# Patient Record
Sex: Female | Born: 1970 | Race: White | Hispanic: No | Marital: Married | Smoking: Former smoker
Health system: Southern US, Community
[De-identification: ages and names within clinical notes are randomized; demographics above are authoritative.]

## PROBLEM LIST (undated history)

## (undated) DIAGNOSIS — D259 Leiomyoma of uterus, unspecified: Secondary | ICD-10-CM

## (undated) DIAGNOSIS — K5909 Other constipation: Secondary | ICD-10-CM

## (undated) DIAGNOSIS — N3010 Interstitial cystitis (chronic) without hematuria: Secondary | ICD-10-CM

## (undated) DIAGNOSIS — K9041 Non-celiac gluten sensitivity: Secondary | ICD-10-CM

## (undated) DIAGNOSIS — D509 Iron deficiency anemia, unspecified: Secondary | ICD-10-CM

## (undated) DIAGNOSIS — F411 Generalized anxiety disorder: Secondary | ICD-10-CM

## (undated) DIAGNOSIS — N939 Abnormal uterine and vaginal bleeding, unspecified: Secondary | ICD-10-CM

## (undated) DIAGNOSIS — O34219 Maternal care for unspecified type scar from previous cesarean delivery: Secondary | ICD-10-CM

## (undated) DIAGNOSIS — Z973 Presence of spectacles and contact lenses: Secondary | ICD-10-CM

## (undated) DIAGNOSIS — K635 Polyp of colon: Secondary | ICD-10-CM

## (undated) DIAGNOSIS — J302 Other seasonal allergic rhinitis: Secondary | ICD-10-CM

## (undated) DIAGNOSIS — F419 Anxiety disorder, unspecified: Secondary | ICD-10-CM

## (undated) HISTORY — PX: TYMPANOSTOMY TUBE PLACEMENT: SHX32

## (undated) HISTORY — DX: Maternal care for unspecified type scar from previous cesarean delivery: O34.219

## (undated) HISTORY — DX: Polyp of colon: K63.5

## (undated) HISTORY — DX: Anxiety disorder, unspecified: F41.9

## (undated) HISTORY — PX: CHOLECYSTECTOMY: SHX55

---

## 1997-05-25 ENCOUNTER — Other Ambulatory Visit: Admission: RE | Admit: 1997-05-25 | Discharge: 1997-05-25 | Payer: Self-pay | Admitting: Obstetrics and Gynecology

## 1998-07-16 ENCOUNTER — Other Ambulatory Visit: Admission: RE | Admit: 1998-07-16 | Discharge: 1998-07-16 | Payer: Self-pay | Admitting: Gynecology

## 1998-10-17 ENCOUNTER — Encounter: Admission: RE | Admit: 1998-10-17 | Discharge: 1999-01-15 | Payer: Self-pay | Admitting: Gynecology

## 1999-04-08 ENCOUNTER — Inpatient Hospital Stay (HOSPITAL_COMMUNITY): Admission: AD | Admit: 1999-04-08 | Discharge: 1999-04-11 | Payer: Self-pay | Admitting: Gynecology

## 1999-04-08 ENCOUNTER — Encounter (INDEPENDENT_AMBULATORY_CARE_PROVIDER_SITE_OTHER): Payer: Self-pay

## 1999-05-22 ENCOUNTER — Other Ambulatory Visit: Admission: RE | Admit: 1999-05-22 | Discharge: 1999-05-22 | Payer: Self-pay | Admitting: Gynecology

## 2000-05-27 ENCOUNTER — Other Ambulatory Visit: Admission: RE | Admit: 2000-05-27 | Discharge: 2000-05-27 | Payer: Self-pay | Admitting: Gynecology

## 2001-05-27 ENCOUNTER — Other Ambulatory Visit: Admission: RE | Admit: 2001-05-27 | Discharge: 2001-05-27 | Payer: Self-pay | Admitting: Gynecology

## 2002-07-08 ENCOUNTER — Other Ambulatory Visit: Admission: RE | Admit: 2002-07-08 | Discharge: 2002-07-08 | Payer: Self-pay | Admitting: Gynecology

## 2003-07-14 ENCOUNTER — Other Ambulatory Visit: Admission: RE | Admit: 2003-07-14 | Discharge: 2003-07-14 | Payer: Self-pay | Admitting: Gynecology

## 2004-07-16 ENCOUNTER — Other Ambulatory Visit: Admission: RE | Admit: 2004-07-16 | Discharge: 2004-07-16 | Payer: Self-pay | Admitting: Gynecology

## 2005-07-18 ENCOUNTER — Other Ambulatory Visit: Admission: RE | Admit: 2005-07-18 | Discharge: 2005-07-18 | Payer: Self-pay | Admitting: Gynecology

## 2005-08-17 ENCOUNTER — Emergency Department (HOSPITAL_COMMUNITY): Admission: EM | Admit: 2005-08-17 | Discharge: 2005-08-17 | Payer: Self-pay | Admitting: Family Medicine

## 2005-11-04 ENCOUNTER — Encounter: Admission: RE | Admit: 2005-11-04 | Discharge: 2005-11-04 | Payer: Self-pay | Admitting: Gynecology

## 2005-11-12 ENCOUNTER — Encounter: Admission: RE | Admit: 2005-11-12 | Discharge: 2005-11-12 | Payer: Self-pay | Admitting: Gynecology

## 2006-06-10 ENCOUNTER — Emergency Department (HOSPITAL_COMMUNITY): Admission: EM | Admit: 2006-06-10 | Discharge: 2006-06-10 | Payer: Self-pay | Admitting: Emergency Medicine

## 2006-06-11 ENCOUNTER — Ambulatory Visit (HOSPITAL_COMMUNITY): Admission: RE | Admit: 2006-06-11 | Discharge: 2006-06-11 | Payer: Self-pay | Admitting: Emergency Medicine

## 2006-06-13 ENCOUNTER — Emergency Department (HOSPITAL_COMMUNITY): Admission: EM | Admit: 2006-06-13 | Discharge: 2006-06-13 | Payer: Self-pay | Admitting: Emergency Medicine

## 2006-06-16 ENCOUNTER — Encounter: Admission: RE | Admit: 2006-06-16 | Discharge: 2006-06-16 | Payer: Self-pay | Admitting: Gastroenterology

## 2006-06-21 ENCOUNTER — Emergency Department (HOSPITAL_COMMUNITY): Admission: EM | Admit: 2006-06-21 | Discharge: 2006-06-22 | Payer: Self-pay | Admitting: Emergency Medicine

## 2006-06-26 ENCOUNTER — Inpatient Hospital Stay (HOSPITAL_COMMUNITY): Admission: EM | Admit: 2006-06-26 | Discharge: 2006-06-29 | Payer: Self-pay | Admitting: Emergency Medicine

## 2006-06-27 ENCOUNTER — Ambulatory Visit: Payer: Self-pay | Admitting: Gastroenterology

## 2006-06-27 ENCOUNTER — Encounter: Payer: Self-pay | Admitting: Gastroenterology

## 2006-06-27 HISTORY — PX: ESOPHAGOGASTRODUODENOSCOPY (EGD) WITH PROPOFOL: SHX5813

## 2006-06-28 ENCOUNTER — Ambulatory Visit: Payer: Self-pay | Admitting: Gastroenterology

## 2006-06-30 ENCOUNTER — Observation Stay (HOSPITAL_COMMUNITY): Admission: EM | Admit: 2006-06-30 | Discharge: 2006-07-01 | Payer: Self-pay | Admitting: Emergency Medicine

## 2006-06-30 ENCOUNTER — Ambulatory Visit: Payer: Self-pay | Admitting: Internal Medicine

## 2006-07-09 ENCOUNTER — Other Ambulatory Visit: Admission: RE | Admit: 2006-07-09 | Discharge: 2006-07-09 | Payer: Self-pay | Admitting: Gynecology

## 2006-07-22 ENCOUNTER — Ambulatory Visit: Payer: Self-pay | Admitting: Family Medicine

## 2006-07-24 ENCOUNTER — Ambulatory Visit (HOSPITAL_COMMUNITY): Admission: RE | Admit: 2006-07-24 | Discharge: 2006-07-24 | Payer: Self-pay | Admitting: Gastroenterology

## 2006-07-24 ENCOUNTER — Encounter (INDEPENDENT_AMBULATORY_CARE_PROVIDER_SITE_OTHER): Payer: Self-pay | Admitting: Gastroenterology

## 2006-07-24 DIAGNOSIS — K635 Polyp of colon: Secondary | ICD-10-CM

## 2006-07-24 HISTORY — DX: Polyp of colon: K63.5

## 2006-09-04 ENCOUNTER — Encounter: Admission: RE | Admit: 2006-09-04 | Discharge: 2006-09-04 | Payer: Self-pay | Admitting: Gastroenterology

## 2006-09-10 ENCOUNTER — Emergency Department (HOSPITAL_COMMUNITY): Admission: EM | Admit: 2006-09-10 | Discharge: 2006-09-10 | Payer: Self-pay | Admitting: Emergency Medicine

## 2006-10-14 ENCOUNTER — Encounter: Admission: RE | Admit: 2006-10-14 | Discharge: 2006-10-14 | Payer: Self-pay | Admitting: Gynecology

## 2007-01-03 ENCOUNTER — Emergency Department (HOSPITAL_COMMUNITY): Admission: EM | Admit: 2007-01-03 | Discharge: 2007-01-03 | Payer: Self-pay | Admitting: Family Medicine

## 2007-01-27 ENCOUNTER — Inpatient Hospital Stay (HOSPITAL_COMMUNITY): Admission: AD | Admit: 2007-01-27 | Discharge: 2007-01-27 | Payer: Self-pay | Admitting: Obstetrics and Gynecology

## 2007-05-10 ENCOUNTER — Inpatient Hospital Stay (HOSPITAL_COMMUNITY): Admission: AD | Admit: 2007-05-10 | Discharge: 2007-05-10 | Payer: Self-pay | Admitting: Obstetrics and Gynecology

## 2007-05-21 ENCOUNTER — Inpatient Hospital Stay (HOSPITAL_COMMUNITY): Admission: AD | Admit: 2007-05-21 | Discharge: 2007-05-23 | Payer: Self-pay | Admitting: Obstetrics & Gynecology

## 2008-06-06 ENCOUNTER — Encounter: Payer: Self-pay | Admitting: Gynecology

## 2008-06-06 ENCOUNTER — Other Ambulatory Visit: Admission: RE | Admit: 2008-06-06 | Discharge: 2008-06-06 | Payer: Self-pay | Admitting: Gynecology

## 2008-06-06 ENCOUNTER — Ambulatory Visit: Payer: Self-pay | Admitting: Gynecology

## 2008-07-11 ENCOUNTER — Ambulatory Visit: Payer: Self-pay | Admitting: Gynecology

## 2008-08-15 ENCOUNTER — Ambulatory Visit: Payer: Self-pay | Admitting: Gynecology

## 2008-09-14 ENCOUNTER — Ambulatory Visit: Payer: Self-pay | Admitting: Gynecology

## 2008-10-05 ENCOUNTER — Ambulatory Visit: Payer: Self-pay | Admitting: Gynecology

## 2008-11-22 ENCOUNTER — Encounter: Admission: RE | Admit: 2008-11-22 | Discharge: 2008-11-22 | Payer: Self-pay | Admitting: Gynecology

## 2009-02-13 ENCOUNTER — Ambulatory Visit: Payer: Self-pay | Admitting: Gynecology

## 2009-02-21 ENCOUNTER — Ambulatory Visit: Payer: Self-pay | Admitting: Gynecology

## 2009-03-21 ENCOUNTER — Ambulatory Visit: Payer: Self-pay | Admitting: Gynecology

## 2009-06-07 ENCOUNTER — Ambulatory Visit: Payer: Self-pay | Admitting: Gynecology

## 2009-06-07 ENCOUNTER — Other Ambulatory Visit: Admission: RE | Admit: 2009-06-07 | Discharge: 2009-06-07 | Payer: Self-pay | Admitting: Gynecology

## 2009-11-26 ENCOUNTER — Encounter: Admission: RE | Admit: 2009-11-26 | Discharge: 2009-11-26 | Payer: Self-pay | Admitting: Gynecology

## 2010-02-17 ENCOUNTER — Encounter: Payer: Self-pay | Admitting: Gastroenterology

## 2010-02-17 ENCOUNTER — Encounter: Payer: Self-pay | Admitting: Family Medicine

## 2010-06-10 ENCOUNTER — Encounter (INDEPENDENT_AMBULATORY_CARE_PROVIDER_SITE_OTHER): Payer: BC Managed Care – PPO | Admitting: Gynecology

## 2010-06-10 ENCOUNTER — Other Ambulatory Visit (HOSPITAL_COMMUNITY)
Admission: RE | Admit: 2010-06-10 | Discharge: 2010-06-10 | Disposition: A | Discharge status: Home / Self Care | Payer: BC Managed Care – PPO | Source: Ambulatory Visit | Attending: Gynecology | Admitting: Gynecology

## 2010-06-10 ENCOUNTER — Other Ambulatory Visit: Payer: Self-pay | Admitting: Gynecology

## 2010-06-10 DIAGNOSIS — Z01419 Encounter for gynecological examination (general) (routine) without abnormal findings: Secondary | ICD-10-CM

## 2010-06-10 DIAGNOSIS — Z833 Family history of diabetes mellitus: Secondary | ICD-10-CM

## 2010-06-10 DIAGNOSIS — R82998 Other abnormal findings in urine: Secondary | ICD-10-CM

## 2010-06-10 DIAGNOSIS — Z13220 Encounter for screening for lipoid disorders: Secondary | ICD-10-CM

## 2010-06-10 DIAGNOSIS — Z124 Encounter for screening for malignant neoplasm of cervix: Secondary | ICD-10-CM | POA: Insufficient documentation

## 2010-06-10 DIAGNOSIS — R635 Abnormal weight gain: Secondary | ICD-10-CM

## 2010-06-11 NOTE — Op Note (Signed)
Kristy Hancock, [place]                    ACCOUNT NO.:  [id number]   MEDICAL RECORD NO.:  [id number]          PATIENT TYPE:  INP   LOCATION:  A319                          FACILITY:  [facility]   PHYSICIAN:  Kassie Mends, M.D.      DATE OF BIRTH:  [birth year]   DATE OF PROCEDURE:  06/27/2006  DATE OF DISCHARGE:                                PROCEDURE NOTE   PROCEDURE:  Esophagogastroduodenoscopy with cold forceps biopsies of the  esophagus, stomach, and duodenum.   INDICATIONS FOR EXAMINATION:  Mrs. Maclaren is a 40 year old female with  unremitting epigastric and chest pain as well as difficulty swallowing.   FINDINGS:  1. Normal esophagus without evidence of Barrett's, mass, erosions, or      ulcerations.  Biopsied obtained to evaluate for eosinophilic      esophagitis.  Patchy erythema in the body and antrum of the stomach      without evidence of erosions, ulcerations, or mass.  Biopsies      obtained to evaluate for H. pylori gastritis or eosinophilic      gastritis.  2. Normal duodenal bulb and second and third portion of the duodenum.      Biopsies were obtained to evaluate for celiac sprue.   RECOMMENDATIONS:  1. Continue proton pump inhibitor.  The patient should avoid gastric      irritants.  She was given a handout on identifying gastric      irritants.  2. She should continue a bland diet.  3. She should avoid aspirin, NSAIDs, and anticoagulation for 3 days.      Then, she can add Celebrex 100 mg tablets twice daily to help      control her pain.  4. She should continue Elavil 10 mg at night for pain modulation.  She      may use Percocet 5/325 one or 2 every 6 hours as needed for pain.  5. She has a return patient visit to see Dr. Cira Servant in 7 to 10 days.  6. We will call Ms. Hunnicutt with the results of her biopsies.   MEDICATIONS:  1. Demerol 50 mg IV.  2. Versed 4 mg IV.   PROCEDURE TECHNIQUE:  Physical examination was performed and informed  consent was obtained from the patient  after explaining the benefits,  risks, and alternatives to the procedure.  The patient was connected to  the monitor and placed in the left lateral position.  Continuous oxygen  was provided by nasal cannula.  IV medicine was administered through an  indwelling cannula.  After administration of sedation, the patient's  esophagus was intubated  and the scope was advanced under direct visualization to the second  portion of the duodenum.  The scope was subsequently removed slowly  back, carefully examining the color, texture, anatomy of the mucosa on  the way out.  The patient was recovered in Endoscopy and was discharged  to the floor in satisfactory condition.      Kassie Mends, M.D.  Electronically Signed     SM/MEDQ  D:  06/27/2006  T:  06/27/2006  Job:  [id number]   cc:   Emeterio Reeve, MD  Fax: [phone]

## 2010-06-11 NOTE — Discharge Summary (Signed)
NAMEJAILYNNE, Kristy Hancock                    ACCOUNT NO.:  [id number]   MEDICAL RECORD NO.:  [id number]          PATIENT TYPE:  INP   LOCATION:  A319                          FACILITY:  [facility]   PHYSICIAN:  Skeet Latch, DO    DATE OF BIRTH:  [birth year]   DATE OF ADMISSION:  06/26/2006  DATE OF DISCHARGE:  06/02/2008LH                               DISCHARGE SUMMARY   DISCHARGE DIAGNOSIS:  Epigastric pain of unknown etiology.   BRIEF HOSPITAL COURSE:  Ms. Hancock is a 40 year old Caucasian female who  has had about a six week history of abdominal discomfort, mostly in her  epigastric region.  Patient describes it as a burning, pressure-like  sensation that gets worse with eating.  She also states that she has had  early satiety.  The pain would increase with sitting up and sometimes it  gets worse when she lies flat.  The pain seemed to radiate to her back  and chest, especially on the left side.  The patient has seen various  physicians over the past six weeks or so for this abdominal pain.  She  has had multiple CAT scans of the abdomen that showed no acute process.  She also had an ultrasound of the abdomen that did not show any acute  biliary process or gallstones but did note a 9 mm liver hemangioma.  Patient did have a CT of her abdomen on Jun 16, 2006, which was negative  but did show prominent right hilar adenopathy.  Also had an abdominal  CT, which showed no findings.   Patient underwent a HIDA scan on Jun 26, 2006, which was negative.  Patient did undergo an EGD by [facility] two weeks prior,  which was unremarkable, and also saw a rheumatologist, who did not feel  was a rheumatologic issue.   On this admission, as stated above, radiologic scans were done.  The  patient also had a GI consult and had an EGD performed, which is pretty  unremarkable, and now they are awaiting biopsies.  Patient was placed on  IV Protonix, which was increased to b.i.d.  Maalox was added.   It is  felt patient may have esophagitis or esophageal spasm.  The patient  still has some discomfort at present, but it is improved.   PHYSICAL EXAMINATION:  VITALS ON DISCHARGE:  Temperature is 98.4, pulse  78, respirations 28.  Blood pressure is 96/59.  Patient satting at 99%  on room air.  CARDIOVASCULAR:  Regular rate and rhythm with no murmurs, rubs or  gallops noted.  LUNGS:  Clear to auscultation bilaterally.  No rales, rhonchi or  wheezes.  ABDOMEN:  Positive epigastric pain on palpation.  She had some slight  diffuse lower abdominal pain on exam.  EXTREMITIES:  No clubbing, cyanosis, or erythema.   CONDITION ON DISCHARGE:  Stable.   MEDICATIONS ON DISCHARGE:  1. Nexium 40 mg b.i.d.  2. Celebrex 100 mg daily, to be started on July 01, 2006.  3. Elavil 10 mg p.o. nightly.  4. Percocet q.4h. p.r.n. as needed.  I spoke with gastroenterology.  Would hold the hyoscyamine until she is  evaluated at gastroenterology visit.   DIET:  She is to maintain a soft diet.  Avoid any gastric irritants.   ACTIVITY:  No restrictions.   Patient is to follow up with her PCP in the next 1-2 weeks.  She also  has an appointment with Dr. Karilyn Cota in the next few days.   I spoke with patient regarding her pain.  Patient is instructed to  follow up with her primary care physician if symptoms continue prior to  the visit with gastroenterology.  Patient understood these instructions.      Skeet Latch, DO  Electronically Signed     SM/MEDQ  D:  06/29/2006  T:  06/30/2006  Job:  [id number]   cc:   Kassie Mends, M.D.  [address]  [place] , [place] [address]

## 2010-06-11 NOTE — Discharge Summary (Signed)
Kristy Hancock, Kristy Hancock                    ACCOUNT NO.:  [id number]   MEDICAL RECORD NO.:  [id number]          PATIENT TYPE:  INP   LOCATION:  A331                          FACILITY:  [facility]   PHYSICIAN:  R. Roetta Sessions, M.D. DATE OF BIRTH:  [birth year]   DATE OF ADMISSION:  06/30/2006  DATE OF DISCHARGE:  06/04/2008LH                               DISCHARGE SUMMARY   ADMISSION DISCHARGE:  Unrelenting upper abdominal and chest pain not  responding to therapy.   DISCHARGE DIAGNOSIS:  Persistent epigastric/chest pain not responding to  therapy.   PROCEDURES:  She had a CT of abdomen and pelvis with contrast on July 01, 2006.  She was found to have a small cyst associated with the right  ovary, nothing to explain her pain.   HISTORY AND PHYSICAL EXAM:  See yesterday's note.   HOSPITAL COURSE:  [name] is a 40 year old female who presented with  unrelenting upper abdominal pain and chest pain which has not responded  to therapy.  She was initially admitted to the hospital on May 30 after  a previous workup by [facility]. She had a normal EGD. She had a normal CT  of abdomen.  Chest CT angiogram showed a mildly prominent right hilar  adenopathy, possible reactive nodes.  During that hospitalization, she  had a fairly extensive workup, was seen and evaluated by Dr. Cira Servant and  had an EGD on Jun 27, 2006, which showed patchy erythema in the body and  antrum without evidence of erosions, ulcerations or mass. Biopsies were  obtained and eosinophilic esophagitis was ruled out.  Since this  admission, she continues to complain of chest pain and upper abdominal  pain. Once again, she had repeat laboratory studies including CBC, sed  rate, LFTs, and MET-7, amylase and lipase, all of which were normal.  She was shown to have trace microscopic hematuria on a couple of  occasions. Dr. Karilyn Cota ordered a 24-hour urine study, which is still  pending, to look for acute intermittent porphyria.  She is being  transferred [facility] for further  evaluation and possible EUS.  The cause of her upper abdominal pain and  chest pain is undetermined at this time but could be related to  functional abdominal pain, abdominal wall pain, or other etiology.  Discharge condition is good.   DISPOSITION:  She is transferred to [facility] for further management and treatment.     Nicholas Lose, N.P.      Jonathon Bellows, M.D.  Electronically Signed   KC/MEDQ  D:  07/02/2006  T:  07/02/2006  Job:  [id number]

## 2010-06-11 NOTE — H&P (Signed)
Kristy Hancock, Kristy Hancock                    ACCOUNT NO.:  [id number]   MEDICAL RECORD NO.:  [id number]          PATIENT TYPE:  INP   LOCATION:  A319                          FACILITY:  [facility]   PHYSICIAN:  Osvaldo Shipper, MD     DATE OF BIRTH:  [birth year]   DATE OF ADMISSION:  06/26/2006  DATE OF DISCHARGE:  LH                              HISTORY & PHYSICAL   PRIMARY CARE [name]:  The patient sees a physician at [facility], Dr. Laurine Blazer.   GASTROENTEROLOGIST:  She has been seen by [facility] as well in the past.  She is scheduled to see Dr. Karilyn Cota next week.   RHEUMATOLOGIST:  She has also been seen by Dr. Coral Spikes, rheumatologist,  for her problems.   ADMITTING DIAGNOSES:  1. Abdominal pain, unclear etiology.  2. Rule out biliary dyskinesia.   CHIEF COMPLAINT:  Abdominal pain, worse since yesterday.   HISTORY OF PRESENT ILLNESS:  The patient is a 40 year old Caucasian  female who was healthy until about 6 weeks ago, when she started  noticing mild abdominal discomfort mostly in her upper abdomen.  The  pain was described variously as burning sensation/pressure-like  sensation which would get worse with eating.  She would have a bloating  sensation as well.  She also give history of early satiety.  The pain  would increase with food and with sitting up and relieved with lying  down.  The pain would seem to radiate to the back, to the chest and  especially to the left side.  With the pain, she would feel hot and  sweaty, but denies any fever.  No history of nausea or vomiting.  Usually no history of diarrhea.  She has had history of constipation in  the past.  She did have a few episodes of diarrhea yesterday, but none  today.  Sometimes with her bowel movements, she notices mucus.  She  denies any tingling or numbness anywhere.  Pain intensity usually is  10/10; currently, she is experiencing no pain.  She has lost about 2  pounds since all these issues started.  She denies  any problems with  urination.  No vaginal discharge.  Sometimes deep breathing makes the  pain worse as well.   She has been seen extensively by various physicians for this issue.  She  has had 4 ER visits for abdominal pain, counting this one.  She has had  CAT scan of her abdomen with IV contrast only which did not show any  evidence of any acute abdominal process.  She had an ultrasound of the  abdomen which did not show any acute biliary process or gallstones; an  incidental 9-mm liver hemangioma was noted.  She had a CT angiogram of  chest as well which showed mildly prominent hilar adenopathy, but no  other acute process was noted.  She underwent an EGD by [facility] about a week to 2 weeks ago which again was  unremarkable.  She was also referred to a rheumatologist, Dr. Coral Spikes,  who felt that this  was not a rheumatological issue.  She has seen at  least 4-5 physicians in the span of the last 6 weeks.   CURRENT MEDICATIONS:  1. Nexium 40 mg once a day.  2. Percocet as needed.  3. She was started on hyoscyamine sulfate 0.125 mg as needed; she was      started on that yesterday and she took one pill, which seemed to      help.  4. She was also on meloxicam for a brief period, which was also      discontinued yesterday.   ALLERGIES:  No known drug allergies.   PAST MEDICAL HISTORY:  Really no medical problems before all this  started.  She has had some ovarian cysts for which she is following with  her gynecologist.  C-section in 2001, no other surgery.  She still has  her gallbladder and appendix.  Last menstrual period was May 5, no  vaginal discharge.   SOCIAL HISTORY:  Lives at [facility] with her husband.  No smoking  use.  No alcohol use or illicit drug use.  Independent with her daily  activities.   FAMILY HISTORY:  Positive for some thyroid issues, polyps, colon cancer,  nothing else elicited.   REVIEW OF SYSTEMS:  Positive for weakness, malaise.   HEENT:  Actually  unremarkable.  GI:  As in HPI.  GU:  Unremarkable.  CARDIOVASCULAR:  Unremarkable.  RESPIRATORY:  Unremarkable.  MUSCULOSKELETAL:  Unremarkable.  NEUROLOGICAL:  Unremarkable.  ENDOCRINE:  Unremarkable.  DERMATOLOGIC:  Unremarkable.  OTHER SYSTEMS:  Unremarkable.   PHYSICAL EXAMINATION:  VITAL SIGNS:  Temperature 97.8, blood pressure  120/83, heart rate 100, respiratory rate 18, saturation 100% on room  air.  GENERAL:  This is a thin white female in no acute distress, appears very  anxious.  HEENT:  There is no pallor, no icterus.  Oral mucous membranes are  moist.  No oral lesions are noted.  LUNGS:  Clear to auscultation bilaterally.  CARDIOVASCULAR:  S1 and S2 are normal, regular.  No murmur is  appreciated.  No S3 or S4.  No carotid bruits.  ABDOMEN:  Soft, scaphoid.  Bowel sounds are present.  Mild guarding is  present, otherwise soft; actually, I could not elicit any tenderness  except for mild tenderness in the epigastric area.  No organomegaly was  appreciated.  No masses were noted.  RECTAL:  Exam is deferred at this time.  EXTREMITIES:  Without edema.  Peripheral pulses are palpable.  NEUROLOGICAL:  The patient is alert, oriented x3 and anxious.  No focal  neurologic deficits are present.   LABORATORY DATA:  Her labs from today are actually completely  unremarkable; this includes a CBC, a CMET and lipase.  She did have a  glucose of 107; that was on the labs from May 26, but also completely  unremarkable.  Back in 2001, she was anemic for a brief period, but her  hemoglobin now is normal.   IMAGING STUDIES:  Nothing done today, but as discussed earlier, she has  had CAT scans and ultrasounds of her abdomen.  The patient tells me that  there was a plan for her to undergo an MRI as well.   ASSESSMENT AND PLAN:  This is a 40 year old Caucasian female with no real medical problems, who presents with a 6-week history of abdominal  pain which got worse over  the past 2 days, especially overnight,  requiring an emergency room visit.  She has been to  the emergency room  at least 4 times in the past 1 month.  Differential diagnoses,  considering her history, include biliary dyskinesia.  Irritable bowel  syndrome is also a possibility.  History was also suggestive of  gastritis, but with the negative esophagogastroduodenoscopy, that is now  unlikely.  She had a CAT scan of her abdomen, though only with  intravenous contrast, which has failed to reveal any pathology in any of  the other organs.  Pancreatitis is unlikely with a normal lipase.   PLAN:  Abdominal pain:  I think this patient needs to be brought in to  undergo HIDA scan and to be evaluated by GI to see if she has any  biliary dyskinesis which could be causing these symptoms.  GI may also  recommend that the patient undergo a repeat CAT scan of her abdomen and  pelvis.  For now we will withhold narcotic agents so that we can  possibly do this HIDA scan sometime today.  A urine pregnancy test would  also be ordered, since it has not been done today.  Proton pump  inhibitor will be given.  We will put her on DVT and GI prophylaxis.  She is a full code.  TSH will be ordered and then we will again defer to  GI if they want to order any further testing at this point.  I have told  the patient if the HIDA scan is negative and if GI does not want to do  any more testing, she can probably go home on Saturday and I have told  her that she may need to be referred to an academic center for further  evaluation.  In any case, the patient will be admitted to undergo HIDA  scan and to be seen by GI.      Osvaldo Shipper, MD  Electronically Signed     GK/MEDQ  D:  06/26/2006  T:  06/26/2006  Job:  [id number]   cc:   Lionel December, M.D.  [address]  [place]  [place] [address]

## 2010-06-11 NOTE — Consult Note (Signed)
Kristy Hancock, Kristy Hancock                    ACCOUNT NO.:  [id number]   MEDICAL RECORD NO.:  [id number]          PATIENT TYPE:  INP   LOCATION:  A319                          FACILITY:  [facility]   PHYSICIAN:  Kassie Mends, M.D.      DATE OF BIRTH:  [birth year]   DATE OF CONSULTATION:  06/26/2006  DATE OF DISCHARGE:                                 CONSULTATION   REQUESTING PHYSICIAN:  [facility].   PRIMARY CARE PHYSICIAN:  Dr. Johnn Hancock, Kristy Hancock, [place].   REASON FOR CONSULTATION:  Abdominal pain.   HISTORY OF PRESENT ILLNESS:  Kristy Hancock is a 40 year old, Caucasian female  with an 8-week history of epigastric soreness that is quite severe at  times.  She rates the pain 10/10 at its worst.  She tells me that  sitting makes it worse as does movement.  She feels better when she is  lying supine.  She also has a chest pressure and pain is worse after  eating.  The pain waxes and wanes.  She had an EGD by Dr. Laural Benes on Jun 18, 2006, which was normal.  She was scheduled to have an MRI of her  chest and abdomen tomorrow on an outpatient basis ordered by Dr.  Laurine Blazer.  She had abdominal ultrasound on May 15, which showed a 9-mm,  hepatic hemangioma.  She had a CT of the abdomen with contrast which was  negative on May 20.  She had a chest CT angiogram which showed a mildly  prominent right hilar adenopathy question reactive nodes.  She had a  negative HIDA scan today.  She had a normal CBC, LFTs, MET 7, amylase  and lipase.  Urine HCG was negative.  She had a urinalysis which showed  trace blood and a few squamous epithelial cells.  She has tried Nexium  40 mg daily and took it for about a week and did not notice any  difference.  She has resumed this a couple of days prior to admission.  She denies any nausea or vomiting.  She has tried Mylanta which does  seem to help.  She has tried Vicodin, oxycodone and meloxicam at home  none of which seem to completely resolve her pain.  She  denies any  typical heartburn and indigestion symptoms.  Denies any regurgitation,  anorexia, satiety, dysphagia or odynophagia.  She tells me that the pain  worsened after she raked on Mother's Day.   PAST MEDICAL HISTORY:  1. Ovarian cyst with C-section.  2. She was evaluated Dr. Coral Spikes in [place] to rule out      rheumatology problems/fibromyalgia.   MEDICATIONS PRIOR TO ADMISSION:  1. Nexium 40 mg daily.  2. Hyoscyamine 0.125 mg q.8 h. p.r.n.  3. Oxycodone p.r.n.   ALLERGIES:  No known drug allergies.   FAMILY HISTORY:  Positive for father with adenomatous polyps and  maternal grandmother with adenomatous polyps.   SOCIAL HISTORY:  She is married.  She has one healthy son.  She has a  remote history of tobacco use.  She denies  any alcohol or drug use.   REVIEW OF SYSTEMS:  CONSTITUTIONAL:  Weight stable.  Denies any fever or  chills.  CARDIOVASCULAR:  She does complain of palpitations.  PULMONARY:  She has noticed some shortness of breath on exertion.  Denies any cough  or hemoptysis.  GI:  See HPI.  Denies any rectal bleeding or melena.  She tells me she alters with constipation occasionally diarrhea, but  this has been longstanding for her.  GYN:  Last menstrual period was Jun 01, 2006.  Her cycles have been somewhat irregular.   PHYSICAL EXAMINATION:  VITAL SIGNS:  Weight 50.9 kg, height 62 inches,  temperature 91, blood pressure 95/49, pulse 69, respirations 20.  GENERAL:  Kristy Hancock is an alert, oriented, pleasant, cooperative,  Caucasian female in no acute distress.  She does appear quite anxious.  She is accompanied by her husband.  HEENT.  Pupils are clear and nonicteric, dry.  Oropharynx pink, moist  without lesions.  NECK:  Supple without thyromegaly.  CHEST:  Regular rhythm.  Normal S1, S2 without murmurs, rubs or gallops.  LUNGS:  Clear to auscultation bilaterally.  She does have mild  tenderness to her sternal borders bilaterally.  ABDOMEN:  Positive bowel  sounds x4.  No bruits auscultated.  Soft,  nondistended.  She does have tenderness to her epigastrium and upper  abdomen with straight leg elevation and chin tuck.  She denies any  rebound tenderness or guarding.  There is no CVAT.  No negative Murphy  sign.  EXTREMITIES:  Without clubbing or edema bilaterally.  SKIN:  Pink warm and dry without any rash or jaundice.   IMPRESSION:  Kristy Hancock is 40 year old female with an 8-week history of  epigastric/chest pressure and pain.  She has had an extensive workup as  described above which includes a negative esophagogastroduodenoscopy,  HIDA scan, ultrasound, abdominal CT with contrast except for right hilar  adenopathy.  She does have significant abdominal wall tenderness  suspicious for abdominal wall pain.  She may have an element of the  nonerosive reflux disease and less likely would be referred back  pain/disc disease.  She is going to need to follow up with her primary  care physician regarding her hilar adenopathy.   RECOMMENDATIONS:  1. Dr. Cira Servant to re-examine and consider abdominal wall pain therapy.  2. I would suggest that she continue PPI.   We would like to thank the [facility] team for allowing [place] to  participate in the care of Kristy Hancock.      Lorenza Burton, N.P.      Kassie Mends, M.D.  Electronically Signed    KJ/MEDQ  D:  06/26/2006  T:  06/26/2006  Job:  [id number]   cc:   Kristy Hancock, M.D.

## 2010-06-11 NOTE — H&P (Signed)
Kristy Hancock                    ACCOUNT NO.:  [id number]   MEDICAL RECORD NO.:  [id number]          PATIENT TYPE:  INP   LOCATION:  A331                          FACILITY:  [facility]   PHYSICIAN:  Lionel December, M.D.    DATE OF BIRTH:  1970-12-01   DATE OF ADMISSION:  06/30/2006  DATE OF DISCHARGE:  LH                              HISTORY & PHYSICAL   REASON FOR ADMISSION:  Unrelenting upper abdominal and chest pain not  responding to therapy.   HISTORY OF PRESENT ILLNESS:  Kristy Hancock is a 40 year old Caucasian female who  was in the usual state of health until Jun 08, 2006, when she noted  midepigastric pain.  It was mild to moderate.  Over the next 2 days pain  involved her upper abdomen and became very intense.  She also  experienced tightness in her chest.  She did not have fever, chills,  dyspnea, nausea or vomiting.  She was begun on a PPI.  She has seen Dr.  Johnn Hai as her primary care physician recently of with onset of  this pain.  Three days after its onset she was seen in emergency room at  [facility].  She had routine blood work, which was within normal limits.  She  had an upper abdominal ultrasound which was negative for cholelithiasis  or dilated bile ducts.  She had a 9 mm echogenic focus in her liver  consistent with a small hemangioma.  The patient was advised to follow  with her primary care physician.  Until this point she did not have one.  She saw Dr. Johnn Hai.  She was begun on a PPI.  She underwent  esophagogastroduodenoscopy by Dr. Olivia Canter.  This exam was normal.  She was given some pain medicine.  She was reevaluated in [facility] emergency  room on May 17 and then again on May 26.  Apparently she was also seen  in emergency room at [facility] and on her second visit she was  admitted, which was on Jun 26, 2006 and again on May 26.  Apparently she  also came to ER at [facility].  Since she was not responding to  therapy, it was felt that she may be  having musculoskeletal arthritic  pain.  She was seen by Dr. Lennox Pippins.  He did not find any  abnormality to account for her pain.   She had abdominopelvic CT on Jun 16, 2006, along with a chest CT.  She  had no evidence of a liver lesion or dilated biliary system.  Pancreas  was also normal.  Chest CT was negative for PE or aneurysm; however, she  was noted to have mildly prominent right hilar lymph nodes.   The patient was finally admitted to [facility] on Jun 26, 2006,  to the hospitalist service.  She was seen in consultation by Dr. Anselmo Rod of our group.  She had a hepatobiliary scan with EF of 58%, which  was normal.  Once again her CBC, LFTs, calcium, electrolytes, amylase  and lipase  were all within normal limits.  She had a urine HCG, which  was negative, as she had had one at [facility] 2 weeks earlier.  The  patient had repeat EGD on Jun 27, 2006.  This, not surprisingly, was  within normal limits.  Biopsy was taken from esophagus, stomach, as well  as from the third part of the duodenum.  Biopsy results were available  earlier today and within normal limits.   She also had H. pylori serology on Jun 22, 2006, which was normal   She had C-reactive protein of 0.0 on Jun 26, 2006.   The patient was begun on low-dose amitriptyline.  She was feeling better  yesterday and was discharged by Dr. Edsel Petrin.  She was advised not  to take meloxicam, which she had taken a few times.  She was advised to  keep a symptom diary.   The patient returned today with intractable pain across her upper  abdomen and some in her chest.  Once again she did not experience fever,  chills, nausea, vomiting or dyspnea.  She was seen by Dr. Margretta Ditty.  Her  lab studies were repeated and was unremarkable.  She was given Dilaudid  IV for pain control.   Kristy Hancock states she has had a feeling of cold, or at times she feels warm  when she has these episodes.  She states since its onset,  she has never  been-pain free although at times it is a mild pain or soreness.  Other  times it is his stabbing pain across her upper abdomen.  Pain is made  worse when she stoops, bends forward.  She also feels bloated.  She has  had irregular bowel movements, which she feels is due to narcotic  therapy and/or antacids.  However, she has not had any melena or rectal  bleeding.  She has been afraid to eat.  She has not experienced any  heartburn, although at times she has felt that food is not digesting.  This morning she had normal stool.  She denies urinary or vaginal  symptoms.  She was on birth control pills until 2 months ago, which she  stopped on her own.  She states she has done this in the past and has  not had any side effects when she has stopped this medicine.  She does  not feel stress.  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  There is no history  of headache, skin rash, or recent insect bites.   She is on:  1. Amitriptyline 10 mg daily.  2. Nexium 40 mg b.i.d.  3. Percocet 5/325 mg q.4h. p.r.n.  She took 5 pills between 11 p.m.      last night through 11 a.m. this morning.  4. Levsin SL t.i.d. p.r.n.  5. Elavil 10 mg p.o. q.h.s.  6. Celebrex 100 mg daily, but she did not take it today or yesterday.   PAST MEDICAL HISTORY:  She had C-section in 2000.  She used to  experience migraines but has not had an episode lately.  History of  pelvic pain or menstrual cramps, easily controlled with birth control  pills but she stopped that 2 months ago.   ALLERGIES:  None known.   FAMILY HISTORY:  Noncontributory.  She had a brother who died of an  accident at age 11.  Her father is in good health.  Her mother has had a  problem with ethanol abuse.   SOCIAL HISTORY:  She is married.  She has one healthy 58-year-old son.  She smoked altogether for 6-8 years but has not for the last 8 years. She drinks alcohol  occasionally, maybe a drink or two to every 3 months.  She has worked with [employer] in the past but for the last year she is working  with [employer].  Her husband is in good health.   PHYSICAL EXAMINATION:  GENERAL:  A pleasant, well-developed, thin  Caucasian female who appears to be comfortable (she was seen after she  had been given Dilaudid IV).  She weighs 49.8 kg.  She is 62 inches  tall.  VITAL SIGNS:  Pulse 94 per minute, blood pressure 112/68, respirations  18, and temperature is 98.9.  HEENT:  Conjunctivae are pink.  Sclerae are nonicteric.  Oropharyngeal  mucosa is normal.  NECK:  No neck masses are noted.  CHEST:  She does not have tenderness over her sternum or costal  cartilages.  CARDIAC:  With regular rhythm.  Normal S1, S3.  No murmur or gallop  noted.  LUNGS:  Clear to auscultation.  ABDOMEN:  Flat.  Bowel sounds are hyperactive.  On palpation is soft  with mild to moderate mid epigastric tenderness.  No hepatomegaly or  splenomegaly.  RECTAL:  Examination deferred.  EXTREMITIES:  No clubbing or edema noted.   Labs from this afternoon:  WBC 8.1, H&H is 14.8 and 42.2, platelet count  is 350,000.  Sodium 137, potassium 3.8, chloride 101, CO2 is 26, glucose  97, BUN 10, creatinine 1.0, calcium is 10.2.  Her amylase is 28, lipase  22.  AST is 29, ALT 35, bilirubin is 0.4, total protein 8.2 and albumin  is 4.7, bilirubin is 0.4, alkaline phosphatase is 66, AST 29, ALT 35,  total protein 8.2 with albumin of 4.7.   ASSESSMENT:  Kristy Hancock is a 40 year old Caucasian female with about a 3 to 3-  1/2-week history of upper abdominal and chest pain, who has had  extensive workup.  She had ultrasound, which was negative for  cholelithiasis or dilated biliary system, with over a 3-week history of  upper abdominal pain as well as chest pain.  She has undergone multiple  studies.  Her CBC, LFTs, serum calcium amylase and lipase have been  normal repeatedly.  She had two  EGDs.  The second EGD was done here over  the weekend, which was normal.  She had normal biopsy from the  esophagus, stomach and duodenum.  She also has undergone abdominopelvic  CT, which was unremarkable, and a chest CT with prominent right hilar  lymph nodes.  This is suspected to be reactive and I feel has nothing to  do with her  symptomatology but nevertheless needs to be further  evaluated.   Given her presentation, one has to wonder about a sphincter of Oddi  dysfunction or small stones in her bile duct which could not be picked  up on imaging studies, but I would expect a bump in her transaminases  given the severity and frequency of pain that she has gone through.  She  could also have a pancreas divisum, but once again her symptoms are  atypical.  She has not experienced any vomiting or bump in her amylase  and lipase but, normal enzymes would not rule out this diagnosis.  In  the end, it may turn out to be chronic dyspepsia, but we need to rule  out a number of conditions including acute intermittent porphyria.  One  also has to wonder if she has a migraine equivalent.  I feel that her  abdominal and chest pain are all tied together.  I doubt that she has  mitral valve prolapse, and she does not have any risk factors for  coronary artery disease other than a prior history of smoking, and she  has never been a heavy smoker.   PLAN:  She will be admitted for pain control.  We will use Dilaudid IV.  Will start on dicyclomine 10 mg t.i.d. and hold off Elavil for now.  Will also check a sedimentation rate and repeat her LFTs in the a.m.   We will make plans for her to be transferred [facility] for EUS to be followed  by ERCP if EUS is negative and if the GI physicians at [facility] agree with  this algorithm.   While she is here, will proceed with MR of chest to further evaluate  right hilar adenopathy.      Lionel December, M.D.  Electronically Signed     NR/MEDQ  D:  06/30/2006   T:  07/01/2006  Job:  [id number]   cc:   Johnn Hai, M.D.  [facility]  [place], [place]

## 2010-06-11 NOTE — Op Note (Signed)
NAMEJEARLINE, Kristy Hancock                    ACCOUNT NO.:  [id number]   MEDICAL RECORD NO.:  [id number]          PATIENT TYPE:  AMB   LOCATION:  ENDO                         FACILITY:  [facility]   PHYSICIAN:  Anselmo Rod, M.D.  DATE OF BIRTH:  08/22/1970   DATE OF PROCEDURE:  DATE OF DISCHARGE:                               OPERATIVE REPORT   PROCEDURE PERFORMED:  Colonoscopy at cold biopsies x6.   ENDOSCOPIST:  Anselmo Rod, M.D.   INSTRUMENT USED:  Pentax video colonoscope.   INDICATIONS FOR PROCEDURE:  A 40 year old white female with a history of  sudden change in bowel habits over the last couple of months with severe  constipation and abdominal pain, undergoing colonoscopy to rule out  colonic polyps, masses, etc.   PREPROCEDURE PREPARATION:  Informed consent was procured from the  patient.  The patient had fasted for 8 hours prior to the procedure and  prepped with a bottle of magnesium citrate and a gallon of NuLYTELY the  night prior to the procedure.  The risks and benefits of the procedure  including a 10% miss rate of cancer and polyp were discussed with the  patient as well.   PREPROCEDURE PHYSICAL:  VITAL SIGNS:  The patient had stable vital  signs.  NECK:  Supple.  CHEST:  Clear to auscultation.  S1, S2 regular.  ABDOMEN:  Soft with normal bowel sounds.   DESCRIPTION OF PROCEDURE:  The patient was placed in the left lateral  decubitus position and sedated with 85 mcg of Fentanyl and 8.5 mg of  Versed given intravenously in slow incremental doses.  Once the patient  was adequately sedate and maintained on low-flow oxygen and continuous  cardiac monitoring, the Pentax video colonoscope was advanced from the  rectum to the cecum.  The appendiceal orifice and ileocecal valve were  visualized and photographed.  The patient had a fairly good prep.  The  terminal ileum appeared healthy and without lesions.  No masses, polyps,  erosions, ulcerations or diverticula were seen  throughout the colon  except for a few small sessile polys in the rectum.  Retroflexion in the  rectum revealed no abnormalities.  Six small sessile polyps were  biopsied from the rectum (cold biopsies x6).  The rest of the exam was  unremarkable.  The patient tolerated the procedure well without  immediate complications.  The patient had some abdominal discomfort with  insufflation of air into the colon, indicating a component of visceral  hypersensitivity consistent with IBS.   IMPRESSION:  1. Six small sessile polyps biopsied from the rectum, otherwise normal      colon up to the terminal ileum.  No evidence of diverticulosis.  2. Abdominal discomfort with insufflation of air into the colon,      indicating a component of visceral hypersensitivity consistent with      irritable bowel syndrome.   RECOMMENDATIONS:  1. Await pathology results.  2. Repeat colonoscopy depending on pathology results.  3. Avoid all nonsteroidals for the next 2 weeks.  4. Continue MiraLax and Colace as needed  for the constipation.  5. Continue a high-fiber diet.  6. Outpatient follow-up as the need arises in the future.      Anselmo Rod, M.D.  Electronically Signed     JNM/MEDQ  D:  07/24/2006  T:  07/24/2006  Job:  [id number]   cc:   Gaetano Hawthorne. Lily Peer, M.D.  Fax: [phone]   Sharlot Gowda, M.D.  Fax: [phone]

## 2010-06-14 NOTE — Discharge Summary (Signed)
[facility]  Patient:    Kristy Hancock, Kristy Hancock                           MRN: [id number] Adm. Date:  [id number] Disc. Date: [id number] Attending:  Tonye Royalty Dictator:   Antony Contras, RNC, [facility]                           Discharge Summary  DISCHARGE DIAGNOSES:          1. Intrauterine pregnancy at term.                               2. Poor biophysical profile with nonreassuring fetal                                  heart rate tracing.  PROCEDURE:                    Primary low transverse cesarean section.  HISTORY OF PRESENT ILLNESS:   The patient is a 40 year old, gravida 1, para 0, ith LMP of July 08, 1998, [facility] of April 13, 1999.  Prenatal course was uncomplicated  until 39-3/7 weeks when the patient presented to the office complaining of decreased fetal movement.  Fetal heart tones were appreciated at that time, but she did have a nonreactive nonstress test and a biophysical profile of 4 to 8 which was actually 4 to 10 when taken into consideration a nonstress test.  Amniotic fluid was 14.5 in the 60th percentile.  Because of the low biophysical profile, the patient was admitted for induction of labor at this time.  PRENATAL LABORATORY DATA:     Blood type is O positive, rubella positive, HBSAG, HIV, RPR all negative.  MSAFP normal.  GBS was negative.  HOSPITAL COURSE:              The patient was admitted for induction of labor. She was brought in initially for cervical ripening after placement of Cervidil.  30 to 40 minutes later she began having some late decellerations.  This resolved and he continued to have spontaneous contractions and had episodes of uterine contractions that lasted four to five minutes and at times would have repeat decellerations.  The decision was made after counseling both the patient and husband to proceed ith cesarean section.  She was taken to the operating room where a primary low transverse  cesarean section was performed by Gaetano Hawthorne. Lily Peer, M.D. under epidural anesthesia.  The patient was delivered of an Apgars 8 and 9, female infant. Weight was 6 pounds 13 ounces.  Cord pH was 7.27, estimated blood loss was 800 cc. Postoperative course was uncomplicated.  The patient remained afebrile and was ble to be discharged on her third postoperative day.  Postoperative CBC was as follows; hematocrit 27.3, hemoglobin 9.9, White blood cells 11.9, platelets 158.  The patient was discharged with infant in satisfactory condition on third postoperative day.  DISPOSITION:                  She was to be followed up in the office in four to six weeks.  She was discharged with prenatal vitamins, iron, Tylox one to two p.o. p.r.n. pain. DD:  04/26/99 TD:  04/26/99 Job: [id number]  [id number]

## 2010-06-14 NOTE — Op Note (Signed)
[facility]  Patient:    Kristy Hancock, Kristy Hancock                           MRN: [id number] Proc. Date: 04/07/99 Adm. Date:  [id number] Attending:  Tonye Royalty                           Operative Report  PREOPERATIVE DIAGNOSES:       1. Term intrauterine pregnancy.                               2. Poor biophysical profile.                               3. Nonreassuring fetal heart rate tracing.  POSTOPERATIVE DIAGNOSES:      1. Term intrauterine pregnancy.                               2. Poor biophysical profile.                               3. Nonreassuring fetal heart rate tracing.  OPERATION:                    Primary lower uterine segment transverse cesarean                               section. SURGEON:                      Juan H. Lily Peer, M.D.  ASSISTANT:                    Alvino Chapel, M.D.  ANESTHESIA:                   Epidural  ESTIMATED BLOOD LOSS:  INDICATIONS:                  A 40 year old, Gravida 1, Para 0, who was seen in the office earlier today complaining of decreased fetal movement and nonreactive NST resulted in a follow up physical profile with total score of 4/10.  The patient was brought in for cervical ripening and induction.  After placement of the Cervidil 30 to 40 minutes later, the patient began having late decelerations.  This resolved and she continued to have spontaneous contractions and had episodes of uterine tetanic contractions that lasted four to five minutes and at times would have repeat late decelerations.  FINDINGS:                     Viable female infant, Apgars of 8 and 9 with a weight of 6 pounds 13 ounces.  Arterial cord pH of 7.27.  Clear amniotic fluid was present.  Normal maternal uterus, tubes and ovaries.  DESCRIPTION OF PROCEDURE:     After the patient was adequately counselled, she as taken to the operating room where she was placed in the supine position.   A Foley catheter had previously been inserted in labor and delivery and the epidural was on board.  The abdomen was  prepped and draped in the usual sterile fashion.  A Pfannenstiel skin incision was made 2 cm above the symphysis pubis.  The incision was carried down from the skin and subcutaneous tissue down to the rectus fascia where by a midline nick was made.  The fascia was incised in a transverse fashion. The midline raphe was entered.  The peritoneal cavity was entered cautiously. he bladder flap was developed and the lower uterine segment was incised in a transverse fashion.  Clear amniotic fluid was present.  A newborn was delivered. There was no evidence of any nuchal cord.  Clear amniotic fluid was present. The cord was doubly clamped and excised and was passed off to the pediatricians who  gave the above mentioned parameters.  After cord blood was obtained, the uterus was exteriorized.  The placenta was delivered from the intrauterine cavity. Remaining products of conception were swept clear with lap sponges.  The uterine incision was closed with a running stitch of 0 Vicryl suture in a locking stitch manner. The uterus was then placed back into the abdominal pelvic cavity.  The pelvic cavity was copiously irrigated with normal saline solution.  Sponge count and needle count were correct.  The visceral peritoneum was not reapproximated.  The rectus fascia was closed with a running stitch of 0 Vicryl suture.  The subcutaneous bleeders  were Bovie cauterized and the skin was reapproximated with skin clips followed y placement of Xeroform gauze and 4 x 8 dressing.  The patient was transferred to the recovery room with stable vital signs.  Blood loss was 800 cc.  IV fluids was 1000 cc of lactated Ringers.  Urine output was 200 cc and clear.  The patient received a gram of Cefotan IV and Pitocin 10 units per liter was added as a uterotonic agent. DD:  04/08/99 TD:   04/09/99 Job: [id number]

## 2010-07-15 ENCOUNTER — Ambulatory Visit (INDEPENDENT_AMBULATORY_CARE_PROVIDER_SITE_OTHER): Payer: BC Managed Care – PPO | Admitting: Gynecology

## 2010-07-15 DIAGNOSIS — R82998 Other abnormal findings in urine: Secondary | ICD-10-CM

## 2010-07-15 DIAGNOSIS — R350 Frequency of micturition: Secondary | ICD-10-CM

## 2010-07-15 DIAGNOSIS — N926 Irregular menstruation, unspecified: Secondary | ICD-10-CM

## 2010-07-15 DIAGNOSIS — N390 Urinary tract infection, site not specified: Secondary | ICD-10-CM

## 2010-08-01 ENCOUNTER — Encounter: Payer: Self-pay | Admitting: Gynecology

## 2010-08-01 ENCOUNTER — Ambulatory Visit (INDEPENDENT_AMBULATORY_CARE_PROVIDER_SITE_OTHER): Payer: BC Managed Care – PPO | Admitting: Gynecology

## 2010-08-01 DIAGNOSIS — N949 Unspecified condition associated with female genital organs and menstrual cycle: Secondary | ICD-10-CM

## 2010-08-01 DIAGNOSIS — Z3041 Encounter for surveillance of contraceptive pills: Secondary | ICD-10-CM

## 2010-08-01 DIAGNOSIS — Z30431 Encounter for routine checking of intrauterine contraceptive device: Secondary | ICD-10-CM

## 2010-08-01 DIAGNOSIS — R350 Frequency of micturition: Secondary | ICD-10-CM

## 2010-08-01 DIAGNOSIS — R82998 Other abnormal findings in urine: Secondary | ICD-10-CM

## 2010-08-20 ENCOUNTER — Telehealth: Payer: Self-pay | Admitting: *Deleted

## 2010-08-20 NOTE — Telephone Encounter (Signed)
Patient can try Junel 1/20 or Cryselle. Star with new cysle. #30 with 11 refills or until her nexr annual

## 2010-08-20 NOTE — Telephone Encounter (Signed)
PT CALLED STATING THAT SHE LIKES BEING ON ALESSE BCP BUT HER INSURANCE DOES NOT COVER HER PILLS. PT WANTS TO KNOW IF SHE CAN POSSIBLY SWITCH TO LESS EXPENSIVE PILL? SHE WANTS SOMETHING SIMILAR TO ALESSE IF POSSIBLE. PT STATES IF NONETHING CHEAPER THEN SHE WILL STICK WITH ALESSE. PLEASE ADVISE. THANKS

## 2010-08-21 NOTE — Telephone Encounter (Signed)
PT CALLED OFFICE TODAY C/O LOWER ABDOMEN PAIN SEE CHART NOTE (08/06/10) PER JF NOTE HE WANTS PT TO DR.TANNENBAUM AT [facility]. PT INFORMED TO SEE THEM REGARDING ISSUES. PER JF NOTE DUE TO RECURRENT OF PAIN. PT STATES PAIN IS TOLERABLE. PT ALSO STATES THAT SHE WILL CONTINUE TO TAKE HER ALESSE BCP.(08/20/10 NOTE BELOW)

## 2010-08-22 ENCOUNTER — Other Ambulatory Visit: Payer: Self-pay | Admitting: Obstetrics

## 2010-08-22 DIAGNOSIS — R102 Pelvic and perineal pain: Secondary | ICD-10-CM

## 2010-08-22 DIAGNOSIS — D219 Benign neoplasm of connective and other soft tissue, unspecified: Secondary | ICD-10-CM

## 2010-08-23 ENCOUNTER — Ambulatory Visit (HOSPITAL_COMMUNITY): Payer: BC Managed Care – PPO

## 2010-08-23 ENCOUNTER — Ambulatory Visit (HOSPITAL_COMMUNITY)
Admission: RE | Admit: 2010-08-23 | Discharge: 2010-08-23 | Disposition: A | Discharge status: Home / Self Care | Payer: BC Managed Care – PPO | Source: Ambulatory Visit | Attending: Obstetrics | Admitting: Obstetrics

## 2010-08-23 ENCOUNTER — Other Ambulatory Visit (HOSPITAL_COMMUNITY): Payer: BC Managed Care – PPO

## 2010-08-23 DIAGNOSIS — D219 Benign neoplasm of connective and other soft tissue, unspecified: Secondary | ICD-10-CM

## 2010-08-23 DIAGNOSIS — N949 Unspecified condition associated with female genital organs and menstrual cycle: Secondary | ICD-10-CM | POA: Insufficient documentation

## 2010-08-23 DIAGNOSIS — R102 Pelvic and perineal pain: Secondary | ICD-10-CM

## 2010-08-23 DIAGNOSIS — D251 Intramural leiomyoma of uterus: Secondary | ICD-10-CM | POA: Insufficient documentation

## 2010-10-22 LAB — CBC
HCT: 34 — ABNORMAL LOW
HCT: 37.6
Hemoglobin: 11.8 — ABNORMAL LOW
Hemoglobin: 13.3
MCHC: 34.6
MCV: 91.7
Platelets: 173
RBC: 3.71 — ABNORMAL LOW
RBC: 4.16
RDW: 13.5
WBC: 11.1 — ABNORMAL HIGH
WBC: 11.3 — ABNORMAL HIGH

## 2010-11-01 LAB — URINALYSIS, ROUTINE W REFLEX MICROSCOPIC
Glucose, UA: NEGATIVE
Protein, ur: NEGATIVE
pH: 7

## 2010-11-01 LAB — CBC
MCHC: 35.1
RBC: 4
WBC: 10.4

## 2010-11-04 LAB — POCT RAPID STREP A: Streptococcus, Group A Screen (Direct): NEGATIVE

## 2010-11-08 ENCOUNTER — Ambulatory Visit (HOSPITAL_BASED_OUTPATIENT_CLINIC_OR_DEPARTMENT_OTHER)
Admission: RE | Admit: 2010-11-08 | Discharge: 2010-11-08 | Disposition: A | Discharge status: Home / Self Care | Payer: BC Managed Care – PPO | Source: Ambulatory Visit | Attending: Urology | Admitting: Urology

## 2010-11-08 ENCOUNTER — Other Ambulatory Visit: Payer: Self-pay | Admitting: Urology

## 2010-11-08 DIAGNOSIS — R300 Dysuria: Secondary | ICD-10-CM | POA: Insufficient documentation

## 2010-11-08 DIAGNOSIS — N3010 Interstitial cystitis (chronic) without hematuria: Secondary | ICD-10-CM | POA: Insufficient documentation

## 2010-11-08 HISTORY — PX: CYSTOSCOPY WITH HYDRODISTENSION AND BIOPSY: SHX5127

## 2010-11-11 LAB — DIFFERENTIAL
Lymphocytes Relative: 37
Lymphs Abs: 2.3
Monocytes Relative: 8
Neutro Abs: 3.3
Neutrophils Relative %: 53

## 2010-11-11 LAB — URINALYSIS, ROUTINE W REFLEX MICROSCOPIC
Nitrite: NEGATIVE
Specific Gravity, Urine: 1.005
Urobilinogen, UA: 0.2
pH: 6.5

## 2010-11-11 LAB — COMPREHENSIVE METABOLIC PANEL
ALT: 16
BUN: 12
Calcium: 8.3 — ABNORMAL LOW
Creatinine, Ser: 0.68
Glucose, Bld: 90
Sodium: 137
Total Protein: 6.7

## 2010-11-11 LAB — CBC
Hemoglobin: 12.7
MCHC: 34.5
MCV: 87.1
RDW: 13.1

## 2010-11-11 LAB — HCG, QUANTITATIVE, PREGNANCY: hCG, Beta Chain, Quant, S: 232 — ABNORMAL HIGH

## 2010-11-11 LAB — LIPASE, BLOOD: Lipase: 17

## 2010-11-11 LAB — POCT PREGNANCY, URINE: Operator id: 235261

## 2010-11-14 LAB — PORPHYRINS, FRACTIONATED URINE (TIMED COLLECTION)
Coproporphyrin, U: 7 (ref 0–22)
Creatinine, Urine mg/day-PORPF: 446 mg/d — ABNORMAL LOW (ref 700–1600)
Creatinine, Urine-mg/dL-PORPF: 18 mg/dL
Heptacarboxyl Porphyrin, 24H Ur: 0 (ref 0–2)
Porphyrins Interpretation: NORMAL
Uroporphyrin, Urine: 0 (ref 0–4)

## 2010-11-14 LAB — BASIC METABOLIC PANEL
BUN: 4 — ABNORMAL LOW
Calcium: 8.8
GFR calc non Af Amer: >60
Potassium: 4.5
Sodium: 139

## 2010-11-14 LAB — DIFFERENTIAL
Basophils Relative: 0
Eosinophils Absolute: 0.1
Eosinophils Relative: 1
Lymphs Abs: 1.9
Monocytes Absolute: 0.6
Monocytes Relative: 7
Neutrophils Relative %: 68

## 2010-11-14 LAB — URINALYSIS, ROUTINE W REFLEX MICROSCOPIC
Leukocytes, UA: NEGATIVE
Nitrite: NEGATIVE
Protein, ur: NEGATIVE
Specific Gravity, Urine: 1.015
Urobilinogen, UA: 0.2

## 2010-11-14 LAB — COMPREHENSIVE METABOLIC PANEL
ALT: 35
AST: 29
Alkaline Phosphatase: 66
Calcium: 10.2
GFR calc Af Amer: >60
Glucose, Bld: 97
Potassium: 3.8
Sodium: 137
Total Protein: 8.2

## 2010-11-14 LAB — HEPATIC FUNCTION PANEL
AST: 26
Bilirubin, Direct: 0.1
Indirect Bilirubin: 0.3
Total Bilirubin: 0.4

## 2010-11-14 LAB — URINE MICROSCOPIC-ADD ON

## 2010-11-14 LAB — SEDIMENTATION RATE: Sed Rate: 2

## 2010-11-14 LAB — CBC
Hemoglobin: 14.8
RBC: 4.9
RDW: 12.5

## 2010-11-14 LAB — AMYLASE: Amylase: 28

## 2010-11-17 NOTE — Op Note (Signed)
  Kristy Hancock, Kristy Hancock                    ACCOUNT NO.:  [id number]  MEDICAL RECORD NO.:  [id number]  LOCATION:                               FACILITY:  [facility]  PHYSICIAN:  Dajion Bickford I. Patsi Sears, M.D.DATE OF BIRTH:  06-Jun-1970  DATE OF PROCEDURE: DATE OF DISCHARGE:                              OPERATIVE REPORT   PREOPERATIVE DIAGNOSIS:  Interstitial cystitis.  POSTOPERATIVE DIAGNOSIS:  Interstitial cystitis.  OPERATION:  Cystourethroscopy, hydrodistention of the bladder (950 cc), cold cup bladder biopsy, cauterization of biopsy sites, and instillation of Pyridium/Marcaine in the bladder, injection of Marcaine/Kenalog in the subtrigonal space.  SURGEON:  Draden Cottingham I. Patsi Sears, M.D.  ANESTHESIA:  General with LMA.  PREPARATION:  After appropriate preanesthesia, the patient was brought to the operating room, placed on the operating table in the dorsal supine position where general LMA anesthesia was introduced.  She was then replaced in the dorsal lithotomy position with the pubis was prepped with Betadine solution and draped in the usual fashion.  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  She has severe anxiety, and dyspareunia.  She has been on antibiotic suppression therapy on postcoital antibiotic.  She is now for cystoscopy and HD.  PROCEDURE IN DETAIL:  Cystourethroscopy was accomplished and showed no evidence of urethral stricture.  The urethra appeared normal.  The meatus was normal as well.  The bladder also appeared normal, with normal trigone, and clear efflux in both orifices.  In addition, there was no evidence of bladder stone tumor or stone tumor or diverticular formation.  The bladder was hydrodistended and holds 950 cc.  Repeat cystoscopy showed some mild glomerulations with petechial hemorrhages.  Biopsies are obtained, and areas were cauterized.  Pyridium/Marcaine solution  was inserted in the bladder, and Marcaine/Kenalog solution was injected in the bladder base.  The patient was then awakened after given IV Toradol, and taken to recovery room in good condition.    Shruthi Northrup I. Patsi Sears, M.D.    SIT/MEDQ  D:  11/08/2010  T:  11/08/2010  Job:  [id number]  Electronically Signed by Jethro Bolus M.D. on 11/17/2010 10:56:46 AM

## 2010-11-26 NOTE — H&P (Signed)
  NAMEEDITHE, DOBBIN                    ACCOUNT NO.:  [id number]  MEDICAL RECORD NO.:  [id number]  LOCATION:                                 FACILITY:  PHYSICIAN:  Amiel Mccaffrey I. Patsi Sears, M.D.DATE OF BIRTH:  Mar 12, 1970  DATE OF ADMISSION: DATE OF DISCHARGE:                             HISTORY & PHYSICAL   HISTORY:  Kristy Hancock is a 40 year old married female, para 2-2-0, referred by Dr. Lily Peer for evaluation of chronic cystitis symptoms, despite adequate antibiotic coverage, but negative cultures.  She complains of dyspareunia, with spicy foods making her suprapubic pain worse.  She has no migraines, no asthma, and no irritable bowel syndrome.  Her PUF score is 14.  PAST MEDICAL HISTORY:  The patient has a past history of anxiety.  PAST SURGICAL HISTORY:  Significant for cesarean section.  MEDICATIONS:  Include ibuprofen.  ALLERGIES:  None known.  FAMILY HISTORY:  Significant for father age 42, mother age 77.  Family history significant for 1 son and 1 daughter.  Father's history is significant for nephrolithiasis and paternal grandfather history of renal failure.  SOCIAL HISTORY:  The patient drinks 3-4 cups of coffee per day.  She is married, and unemployed.  She has a 10 pack year history of smoking, but none x12 years.  Alcohol is none.  CONSTITUTIONAL REVIEW OF SYSTEMS:  Significant for dysuria, suprapubic pain, and dyspareunia.  The patient denies review of systems.  Her head, eyes, ears, nose, throat, chest, abdomen and extremities.  She does admit to anxiety.  PHYSICAL EXAMINATION:  GENERAL:  Shows a well-developed, well-nourished female, in no acute distress.  BMI of 21.53.  BSA 1.52. VITAL SIGNS:  Height 5 feet 2 inches, weight 117 pounds.  Blood pressure 111/76, temperature 98.4, and heart rate is 62. NECK:  Supple,  nontender.  No nodes.  CHEST:  Clear to P and A. ABDOMEN:  Soft, plus bowel sounds without organomegaly or without masses.  GENITOURINARY:   Examination of the genitourinary system shows normal BUS.  The internal examination, she has a tender urethra, but normal in appearance.  There is no urethral mass.  There is no urethral hypermobility, no discharge.  The adnexa are palpably normal.  The bladder is nontender to palpation.  Anus is normal on inspection and perineum is normal.  IMPRESSION:  Interstitial cystitis.  PLAN:  The patient will have outpatient cystoscopy, hydrodistention of bladder, and bladder biopsy.     Christasia Angeletti I. Patsi Sears, M.D.     SIT/MEDQ  D:  11/17/2010  T:  11/17/2010  Job:  [id number]  Electronically Signed by Jethro Bolus M.D. on 11/26/2010 05:25:04 PM

## 2010-12-13 ENCOUNTER — Other Ambulatory Visit: Payer: Self-pay | Admitting: Gynecology

## 2010-12-13 DIAGNOSIS — Z1231 Encounter for screening mammogram for malignant neoplasm of breast: Secondary | ICD-10-CM

## 2011-01-08 ENCOUNTER — Ambulatory Visit
Admission: RE | Admit: 2011-01-08 | Discharge: 2011-01-08 | Disposition: A | Discharge status: Home / Self Care | Payer: BC Managed Care – PPO | Source: Ambulatory Visit | Attending: Gynecology | Admitting: Gynecology

## 2011-01-08 DIAGNOSIS — Z1231 Encounter for screening mammogram for malignant neoplasm of breast: Secondary | ICD-10-CM

## 2011-07-02 ENCOUNTER — Other Ambulatory Visit: Payer: Self-pay | Admitting: Obstetrics and Gynecology

## 2012-03-09 ENCOUNTER — Emergency Department (HOSPITAL_COMMUNITY): Payer: BC Managed Care – PPO

## 2012-03-09 ENCOUNTER — Emergency Department (HOSPITAL_COMMUNITY)
Admission: EM | Admit: 2012-03-09 | Discharge: 2012-03-09 | Disposition: A | Discharge status: Home / Self Care | Payer: BC Managed Care – PPO | Attending: Emergency Medicine | Admitting: Emergency Medicine

## 2012-03-09 ENCOUNTER — Encounter (HOSPITAL_COMMUNITY): Payer: Self-pay | Admitting: Emergency Medicine

## 2012-03-09 DIAGNOSIS — Z79899 Other long term (current) drug therapy: Secondary | ICD-10-CM | POA: Insufficient documentation

## 2012-03-09 DIAGNOSIS — Z860100 Personal history of colon polyps, unspecified: Secondary | ICD-10-CM | POA: Insufficient documentation

## 2012-03-09 DIAGNOSIS — IMO0002 Reserved for concepts with insufficient information to code with codable children: Secondary | ICD-10-CM | POA: Insufficient documentation

## 2012-03-09 DIAGNOSIS — M549 Dorsalgia, unspecified: Secondary | ICD-10-CM | POA: Insufficient documentation

## 2012-03-09 DIAGNOSIS — F411 Generalized anxiety disorder: Secondary | ICD-10-CM | POA: Insufficient documentation

## 2012-03-09 LAB — URINALYSIS, ROUTINE W REFLEX MICROSCOPIC
Bilirubin Urine: NEGATIVE
Glucose, UA: NEGATIVE mg/dL
Hgb urine dipstick: NEGATIVE
Ketones, ur: NEGATIVE mg/dL
Leukocytes, UA: NEGATIVE
Protein, ur: NEGATIVE mg/dL
pH: 6 (ref 5.0–8.0)

## 2012-03-09 MED ORDER — DIPHENHYDRAMINE HCL 25 MG PO CAPS
50.0000 mg | ORAL_CAPSULE | Freq: Once | ORAL | Status: AC
Start: 2012-03-09 — End: 2012-03-09
  Administered 2012-03-09: 50 mg via ORAL
  Filled 2012-03-09: qty 2

## 2012-03-09 MED ORDER — PREDNISONE 20 MG PO TABS: ORAL_TABLET | ORAL | Status: DC

## 2012-03-09 MED ORDER — HYDROMORPHONE HCL PF 1 MG/ML IJ SOLN
1.0000 mg | Freq: Once | INTRAMUSCULAR | Status: AC
  Administered 2012-03-09: 1 mg via INTRAMUSCULAR
  Filled 2012-03-09: qty 1

## 2012-03-09 MED ORDER — OXYCODONE-ACETAMINOPHEN 5-325 MG PO TABS: 2.0000 | ORAL_TABLET | ORAL | Status: DC | PRN

## 2012-03-09 NOTE — Progress Notes (Signed)
Pt confirmed with ed cm that she does not have a pcp but aware how to obtain one via her insurance coverage

## 2012-03-09 NOTE — ED Notes (Signed)
States that she has low back pain that has been hurting since before New Years. States that she has IC and and has been to [facility] and blood testing was done and her urine was tested. Everything was ok and no blood was in her urine. States that her IC was treated and the back pain is progressively worse. States that she was in tears last pm due to the pain.

## 2012-03-09 NOTE — ED Notes (Signed)
md at bedside

## 2012-03-09 NOTE — ED Notes (Signed)
Pt reports she went to [facility] last wedneday. Hx of interstial cycstitis.  Urine was tested, no blood work done. Pt reports right sided flank pain 8/10. Pain back has increased since last Wednesday.

## 2012-03-09 NOTE — ED Notes (Signed)
Pt alert and oriented x4. Respirations even and unlabored, bilateral symmetrical rise and fall of chest. Skin warm and dry. In no acute distress. Denies needs.   

## 2012-03-10 NOTE — ED Provider Notes (Signed)
History     CSN: [id number]  Arrival date & time 03/09/12  1052   First MD Initiated Contact with Patient 03/09/12 1113      Chief Complaint  Patient presents with  . Flank Pain    (Consider location/radiation/quality/duration/timing/severity/associated sxs/prior treatment) HPI....pain left mid back for 6 weeks. No trauma or radiculopathy. No chest pain, shortness breath, fever, chills, weight loss. Palpation makes symptoms worse. Urologist performed a urine sample which was normal.  Past Medical History  Diagnosis Date  . Anxiety   . Colon polyp 07/24/2006    BENIGN  . Vaginal delivery following previous caesarean section     Past Surgical History  Procedure Laterality Date  . Vaginal birth after cesarean section  2009  . Cesarean section  2002    Family History  Problem Relation Age of Onset  . Hypertension Mother   . Ovarian cancer Paternal Grandmother   . Cancer Paternal Grandmother     breast, OVARIAN  . Heart disease Paternal Grandfather     History  Substance Use Topics  . Smoking status: Never Smoker   . Smokeless tobacco: Not on file  . Alcohol Use: No    OB History   Grav Para Term Preterm Abortions TAB SAB Ect Mult Living                  Review of Systems  All other systems reviewed and are negative.    Allergies  Review of patient's allergies indicates no known allergies.  Home Medications   Current Outpatient Rx  Name  Route  Sig  Dispense  Refill  . hydrOXYzine (ATARAX/VISTARIL) 25 MG tablet   Oral   Take 25 mg by mouth at bedtime.         . meloxicam (MOBIC) 15 MG tablet   Oral   Take 15 mg by mouth daily at 12 noon.         . methocarbamol (ROBAXIN) 500 MG tablet   Oral   Take 500 mg by mouth every 12 (twelve) hours.         . norethindrone-ethinyl estradiol (JUNEL FE,GILDESS FE,LOESTRIN FE) 1-20 MG-MCG tablet   Oral   Take 1 tablet by mouth at bedtime.         . pentosan polysulfate (ELMIRON) 100 MG capsule  Oral   Take 100 mg by mouth daily with lunch. She is to take for 7 days and then take one capsule twice daily. She started on 03/03/12.         Marland Kitchen oxyCODONE-acetaminophen (PERCOCET) 5-325 MG per tablet   Oral   Take 2 tablets by mouth every 4 (four) hours as needed for pain.   20 tablet   0   . predniSONE (DELTASONE) 20 MG tablet      3 tabs po day one, then 2 tabs daily x 4 days   11 tablet   0     BP 113/75  Pulse 70  Temp(Src) 98.6 F (37 C) (Oral)  Resp 18  SpO2 98%  LMP 02/24/2012  Physical Exam  Nursing note and vitals reviewed. Constitutional: She is oriented to person, place, and time. She appears well-developed and well-nourished.  No acute distress  HENT:  Head: Normocephalic and atraumatic.  Eyes: Conjunctivae and EOM are normal. Pupils are equal, round, and reactive to light.  Neck: Normal range of motion. Neck supple.  Cardiovascular: Normal rate, regular rhythm and normal heart sounds.   Pulmonary/Chest: Effort normal and breath sounds normal.  Abdominal: Soft. Bowel sounds are normal.  Musculoskeletal:  Minimal tenderness paraspinous area approximately T10  Neurological: She is alert and oriented to person, place, and time.  Skin: Skin is warm and dry.  Psychiatric: She has a normal mood and affect.    ED Course  Procedures (including critical care time)  Labs Reviewed  URINALYSIS, ROUTINE W REFLEX MICROSCOPIC   Dg Thoracic Spine 2 View  03/09/2012  *RADIOLOGY REPORT*  Clinical Data: Back pain in the lower thoracic region for 2 months, getting worse, no known trauma.  THORACIC SPINE - 2 VIEW  Comparison:  CT chest 06/16/2006.  Findings:  There is no evidence of thoracic spine fracture. Alignment is normal.  No other significant bone abnormalities are identified. No change from priors when technique differences are considered.  IMPRESSION: Negative.   Original Report Authenticated By: Davonna Belling, M.D.      1. Back pain       MDM  Plain films  of thoracic spine negative. Rx Percocet #20 and prednisone for 4 days.referral to orthopedics        Donnetta Hutching, MD 03/10/12 [phone]

## 2012-03-11 ENCOUNTER — Encounter (HOSPITAL_COMMUNITY): Payer: Self-pay | Admitting: *Deleted

## 2012-03-11 ENCOUNTER — Emergency Department (HOSPITAL_COMMUNITY)
Admission: EM | Admit: 2012-03-11 | Discharge: 2012-03-11 | Disposition: A | Discharge status: Home / Self Care | Payer: BC Managed Care – PPO | Attending: Emergency Medicine | Admitting: Emergency Medicine

## 2012-03-11 ENCOUNTER — Emergency Department (HOSPITAL_COMMUNITY): Payer: BC Managed Care – PPO

## 2012-03-11 DIAGNOSIS — Z860100 Personal history of colon polyps, unspecified: Secondary | ICD-10-CM | POA: Insufficient documentation

## 2012-03-11 DIAGNOSIS — M549 Dorsalgia, unspecified: Secondary | ICD-10-CM

## 2012-03-11 DIAGNOSIS — F411 Generalized anxiety disorder: Secondary | ICD-10-CM | POA: Insufficient documentation

## 2012-03-11 DIAGNOSIS — M546 Pain in thoracic spine: Secondary | ICD-10-CM | POA: Insufficient documentation

## 2012-03-11 DIAGNOSIS — Z79899 Other long term (current) drug therapy: Secondary | ICD-10-CM | POA: Insufficient documentation

## 2012-03-11 LAB — URINALYSIS, ROUTINE W REFLEX MICROSCOPIC
Bilirubin Urine: NEGATIVE
Glucose, UA: NEGATIVE mg/dL
Nitrite: NEGATIVE
Specific Gravity, Urine: 1.009 (ref 1.005–1.030)
pH: 6 (ref 5.0–8.0)

## 2012-03-11 LAB — URINE MICROSCOPIC-ADD ON

## 2012-03-11 MED ORDER — DIAZEPAM 5 MG PO TABS: 5.0000 mg | ORAL_TABLET | ORAL | Status: DC

## 2012-03-11 MED ORDER — TRAMADOL HCL 50 MG PO TABS: 50.0000 mg | ORAL_TABLET | Freq: Four times a day (QID) | ORAL | Status: DC | PRN

## 2012-03-11 MED ORDER — KETOROLAC TROMETHAMINE 60 MG/2ML IM SOLN
60.0000 mg | Freq: Once | INTRAMUSCULAR | Status: AC
  Administered 2012-03-11: 60 mg via INTRAMUSCULAR
  Filled 2012-03-11: qty 2

## 2012-03-11 NOTE — ED Notes (Addendum)
Increasing mid back pain (right thoracic area).  Pt is on muscle relaxants and pain medication but the pain remains unrelieved.  Pt arrives tearful.  Pt was seen on Tuesday at [facility].  Pt is now having pain radiating to her chest also

## 2012-03-11 NOTE — ED Notes (Signed)
Transported to xray 

## 2012-03-11 NOTE — ED Provider Notes (Signed)
History     CSN: [id number]  Arrival date & time 03/11/12  0929   First MD Initiated Contact with Patient 03/11/12 0932      Chief Complaint  Patient presents with  . Back Pain    (Consider location/radiation/quality/duration/timing/severity/associated sxs/prior treatment) Patient is a 42 y.o. female presenting with back pain. The history is provided by the patient.  Back Pain  patient here complaining of right-sided back pain x7 weeks. Seen recently and [facility] for same and had a negative thoracic spine series. Denies any hematuria or dysuria. Does have a history of IC and ear was negative. Denies any cough or dyspnea. Pain starts at the right midthoracic back and radiates to her right costal margin. Pain is characterized as a burning sensation. No rashes noted. She was placed on prednisone and Percocet without relief of her symptoms.  Past Medical History  Diagnosis Date  . Anxiety   . Colon polyp 07/24/2006    BENIGN  . Vaginal delivery following previous caesarean section     Past Surgical History  Procedure Laterality Date  . Vaginal birth after cesarean section  2009  . Cesarean section  2002    Family History  Problem Relation Age of Onset  . Hypertension Mother   . Ovarian cancer Paternal Grandmother   . Cancer Paternal Grandmother     breast, OVARIAN  . Heart disease Paternal Grandfather     History  Substance Use Topics  . Smoking status: Never Smoker   . Smokeless tobacco: Not on file  . Alcohol Use: No    OB History   Grav Para Term Preterm Abortions TAB SAB Ect Mult Living                  Review of Systems  Musculoskeletal: Positive for back pain.  All other systems reviewed and are negative.    Allergies  Review of patient's allergies indicates no known allergies.  Home Medications   Current Outpatient Rx  Name  Route  Sig  Dispense  Refill  . diphenhydrAMINE (BENADRYL) 25 mg capsule   Oral   Take 25 mg by mouth every 4  (four) hours as needed for itching. Takes when takes Percocet         . hydrOXYzine (ATARAX/VISTARIL) 25 MG tablet   Oral   Take 25 mg by mouth at bedtime.         . methocarbamol (ROBAXIN) 500 MG tablet   Oral   Take 500 mg by mouth every 12 (twelve) hours.         . norethindrone-ethinyl estradiol (JUNEL FE,GILDESS FE,LOESTRIN FE) 1-20 MG-MCG tablet   Oral   Take 1 tablet by mouth at bedtime.         Marland Kitchen oxyCODONE-acetaminophen (PERCOCET/ROXICET) 5-325 MG per tablet   Oral   Take 2 tablets by mouth every 4 (four) hours as needed for pain.         . pentosan polysulfate (ELMIRON) 100 MG capsule   Oral   Take 100 mg by mouth daily with lunch. She is to take for 7 days and then take one capsule twice daily. She started on 03/03/12.         Marland Kitchen predniSONE (DELTASONE) 20 MG tablet   Oral   Take 40-60 mg by mouth See admin instructions. 5 day dose filled on 03-09-12 for back pain. 3 tabs on day one, then 2 tabs daily x 4 days  BP 156/83  Pulse 104  Temp(Src) 100 F (37.8 C) (Oral)  Ht 5\' 2"  (1.575 m)  Wt 120 lb (54.432 kg)  BMI 21.94 kg/m2  SpO2 100%  LMP 02/24/2012  Physical Exam  Nursing note and vitals reviewed. Constitutional: She is oriented to person, place, and time. She appears well-developed and well-nourished.  Non-toxic appearance. No distress.  HENT:  Head: Normocephalic and atraumatic.  Eyes: Conjunctivae, EOM and lids are normal. Pupils are equal, round, and reactive to light.  Neck: Normal range of motion. Neck supple. No tracheal deviation present. No mass present.  Cardiovascular: Normal rate, regular rhythm and normal heart sounds.  Exam reveals no gallop.   No murmur heard. Pulmonary/Chest: Effort normal and breath sounds normal. No stridor. No respiratory distress. She has no decreased breath sounds. She has no wheezes. She has no rhonchi. She has no rales.  Abdominal: Soft. Normal appearance and bowel sounds are normal. She exhibits no  distension. There is no tenderness. There is no rebound and no CVA tenderness.  Musculoskeletal: Normal range of motion. She exhibits no edema and no tenderness.       Right shoulder: She exhibits pain and spasm.       Arms: Neurological: She is alert and oriented to person, place, and time. She has normal strength. No cranial nerve deficit or sensory deficit. GCS eye subscore is 4. GCS verbal subscore is 5. GCS motor subscore is 6.  Skin: Skin is warm and dry. No abrasion and no rash noted.  Psychiatric: She has a normal mood and affect. Her speech is normal and behavior is normal.    ED Course  Procedures (including critical care time)  Labs Reviewed - No data to display Dg Thoracic Spine 2 View  03/09/2012  *RADIOLOGY REPORT*  Clinical Data: Back pain in the lower thoracic region for 2 months, getting worse, no known trauma.  THORACIC SPINE - 2 VIEW  Comparison:  CT chest 06/16/2006.  Findings:  There is no evidence of thoracic spine fracture. Alignment is normal.  No other significant bone abnormalities are identified. No change from priors when technique differences are considered.  IMPRESSION: Negative.   Original Report Authenticated By: Davonna Belling, M.D.      No diagnosis found.    MDM  Patient given Toradol and does flow better at this time. Chest x-ray is negative for rib fractures or pneumothorax. Spoke with patient at length and states that she has seen her Dr. who thinks that her current back pain and she has had for the past several months it is from her IC. I will change the patient's pain medication and instructed her to followup with her Dr.        Toy Baker, MD 03/11/12 1106

## 2012-07-20 ENCOUNTER — Other Ambulatory Visit: Payer: Self-pay | Admitting: Obstetrics and Gynecology

## 2013-09-01 ENCOUNTER — Other Ambulatory Visit: Payer: Self-pay | Admitting: Gastroenterology

## 2013-09-01 DIAGNOSIS — R1013 Epigastric pain: Secondary | ICD-10-CM

## 2013-09-21 ENCOUNTER — Ambulatory Visit (HOSPITAL_COMMUNITY): Payer: BC Managed Care – PPO

## 2013-09-21 ENCOUNTER — Encounter (HOSPITAL_COMMUNITY): Payer: BC Managed Care – PPO

## 2013-09-26 ENCOUNTER — Ambulatory Visit (HOSPITAL_COMMUNITY)
Admission: RE | Admit: 2013-09-26 | Discharge: 2013-09-26 | Disposition: A | Discharge status: Home / Self Care | Payer: BC Managed Care – PPO | Source: Ambulatory Visit | Attending: Gastroenterology | Admitting: Gastroenterology

## 2013-09-26 DIAGNOSIS — R1013 Epigastric pain: Secondary | ICD-10-CM | POA: Insufficient documentation

## 2013-09-26 MED ORDER — STERILE WATER FOR INJECTION IJ SOLN
INTRAMUSCULAR | Status: AC
  Administered 2013-09-26: 10 mL
  Filled 2013-09-26: qty 10

## 2013-09-26 MED ORDER — SINCALIDE 5 MCG IJ SOLR
INTRAMUSCULAR | Status: AC
  Administered 2013-09-26: 5 ug via INTRAVENOUS
  Filled 2013-09-26: qty 5

## 2013-09-26 MED ORDER — TECHNETIUM TC 99M MEBROFENIN IV KIT
5.0000 | PACK | Freq: Once | INTRAVENOUS | Status: AC | PRN
  Administered 2013-09-26: 5 via INTRAVENOUS

## 2013-09-26 MED ORDER — SINCALIDE 5 MCG IJ SOLR
0.0200 ug/kg | Freq: Once | INTRAMUSCULAR | Status: AC
  Administered 2013-09-26: 5 ug via INTRAVENOUS

## 2014-04-16 ENCOUNTER — Emergency Department (HOSPITAL_COMMUNITY)
Admission: EM | Admit: 2014-04-16 | Discharge: 2014-04-16 | Disposition: A | Discharge status: Home / Self Care | Payer: BLUE CROSS/BLUE SHIELD | Attending: Emergency Medicine | Admitting: Emergency Medicine

## 2014-04-16 ENCOUNTER — Emergency Department (HOSPITAL_COMMUNITY): Payer: BLUE CROSS/BLUE SHIELD

## 2014-04-16 ENCOUNTER — Encounter (HOSPITAL_COMMUNITY): Payer: Self-pay | Admitting: Emergency Medicine

## 2014-04-16 DIAGNOSIS — R140 Abdominal distension (gaseous): Secondary | ICD-10-CM | POA: Diagnosis not present

## 2014-04-16 DIAGNOSIS — F419 Anxiety disorder, unspecified: Secondary | ICD-10-CM | POA: Insufficient documentation

## 2014-04-16 DIAGNOSIS — R079 Chest pain, unspecified: Secondary | ICD-10-CM | POA: Diagnosis not present

## 2014-04-16 DIAGNOSIS — M546 Pain in thoracic spine: Secondary | ICD-10-CM

## 2014-04-16 DIAGNOSIS — Z91040 Latex allergy status: Secondary | ICD-10-CM | POA: Diagnosis not present

## 2014-04-16 DIAGNOSIS — Z79899 Other long term (current) drug therapy: Secondary | ICD-10-CM | POA: Diagnosis not present

## 2014-04-16 DIAGNOSIS — R1013 Epigastric pain: Secondary | ICD-10-CM | POA: Diagnosis not present

## 2014-04-16 DIAGNOSIS — R110 Nausea: Secondary | ICD-10-CM | POA: Diagnosis not present

## 2014-04-16 LAB — CBC WITH DIFFERENTIAL/PLATELET
Basophils Absolute: 0 10*3/uL (ref 0.0–0.1)
Basophils Relative: 0 % (ref 0–1)
Eosinophils Absolute: 0.1 10*3/uL (ref 0.0–0.7)
Eosinophils Relative: 2 % (ref 0–5)
HCT: 40.5 % (ref 36.0–46.0)
Hemoglobin: 13.7 g/dL (ref 12.0–15.0)
Lymphocytes Relative: 34 % (ref 12–46)
Lymphs Abs: 2.4 10*3/uL (ref 0.7–4.0)
MCH: 29.5 pg (ref 26.0–34.0)
MCHC: 33.8 g/dL (ref 30.0–36.0)
MCV: 87.1 fL (ref 78.0–100.0)
Monocytes Absolute: 0.5 10*3/uL (ref 0.1–1.0)
Monocytes Relative: 7 % (ref 3–12)
Neutro Abs: 4.2 10*3/uL (ref 1.7–7.7)
Neutrophils Relative %: 57 % (ref 43–77)
Platelets: 274 10*3/uL (ref 150–400)
RBC: 4.65 MIL/uL (ref 3.87–5.11)
RDW: 12.6 % (ref 11.5–15.5)
WBC: 7.3 10*3/uL (ref 4.0–10.5)

## 2014-04-16 LAB — COMPREHENSIVE METABOLIC PANEL
ALT: 17 U/L (ref 0–35)
AST: 19 U/L (ref 0–37)
Albumin: 3.6 g/dL (ref 3.5–5.2)
Alkaline Phosphatase: 62 U/L (ref 39–117)
Anion gap: 6 (ref 5–15)
BUN: 13 mg/dL (ref 6–23)
CO2: 27 mmol/L (ref 19–32)
Calcium: 8.9 mg/dL (ref 8.4–10.5)
Chloride: 105 mmol/L (ref 96–112)
Creatinine, Ser: 0.8 mg/dL (ref 0.50–1.10)
GFR calc Af Amer: >90 mL/min (ref >90)
GFR calc non Af Amer: 88 mL/min — ABNORMAL LOW (ref >90)
Glucose, Bld: 105 mg/dL — ABNORMAL HIGH (ref 70–99)
Potassium: 3.5 mmol/L (ref 3.5–5.1)
Sodium: 138 mmol/L (ref 135–145)
Total Bilirubin: 0.5 mg/dL (ref 0.3–1.2)
Total Protein: 7 g/dL (ref 6.0–8.3)

## 2014-04-16 LAB — I-STAT TROPONIN, ED
Troponin i, poc: 0 ng/mL (ref 0.00–0.08)
Troponin i, poc: 0 ng/mL (ref 0.00–0.08)

## 2014-04-16 LAB — LIPASE, BLOOD: Lipase: 26 U/L (ref 11–59)

## 2014-04-16 MED ORDER — GI COCKTAIL ~~LOC~~
30.0000 mL | Freq: Once | ORAL | Status: AC
  Administered 2014-04-16: 30 mL via ORAL
  Filled 2014-04-16: qty 30

## 2014-04-16 MED ORDER — FAMOTIDINE 20 MG PO TABS
40.0000 mg | ORAL_TABLET | Freq: Once | ORAL | Status: AC
Start: 2014-04-16 — End: 2014-04-16
  Administered 2014-04-16: 40 mg via ORAL
  Filled 2014-04-16: qty 2

## 2014-04-16 MED ORDER — FAMOTIDINE 20 MG PO TABS
20.0000 mg | ORAL_TABLET | Freq: Two times a day (BID) | ORAL | Status: DC
Start: 2014-04-16 — End: 2015-01-18

## 2014-04-16 MED ORDER — ASPIRIN 325 MG PO TABS
325.0000 mg | ORAL_TABLET | Freq: Once | ORAL | Status: AC
  Administered 2014-04-16: 325 mg via ORAL
  Filled 2014-04-16: qty 1

## 2014-04-16 NOTE — ED Provider Notes (Signed)
CSN: [id number]     Arrival date & time 04/16/14  8299 History   First MD Initiated Contact with Patient 04/16/14 0827     Chief Complaint  Patient presents with  . Chest Pain  . Abdominal Pain     (Consider location/radiation/quality/duration/timing/severity/associated sxs/prior Treatment) HPI  Pt is a 44yo female with hx of anxiety, presenting to ED with c/o persistent, gradually worsening upper abdominal pain, worse in epigastrium, radiating into chest and back that started last night. Pain is a burning sensation, tight and squeezing, 8/10. Mild, intermittent nausea w/o vomiting.  Pain is typically worse after eating. States she has been seen by Dr. Collene Mares, GI, in the past, most recently f/u 2 weeks ago and was placed on a pro-biotic.  She has had a HIDA scan, endoscopy as well as colonoscopy w/o significant findings c/w pt's described upper abdominal pain.  Pt states her gallbladder is "normal" and she has not been found to have any gastric ulcers. She is not on a PPI.  She only takes birth control and pro-biotic. Denies fever, chills, vomiting or diarrhea. Denies previous hx of CAD. Denies hx of HTN, high cholesterol, no hx of pancreatitis or heavy alcohol consumption. Does report grandfather who may have had an MI around age 62, otherwise, no family hx of CAD.  Abdominal surgical hx significant ofr c-section. Denies pelvic pain. Denies urinary or vaginal symptoms.    Past Medical History  Diagnosis Date  . Anxiety   . Colon polyp 07/24/2006    BENIGN  . Vaginal delivery following previous caesarean section    Past Surgical History  Procedure Laterality Date  . Vaginal birth after cesarean section  2009  . Cesarean section  2002   Family History  Problem Relation Age of Onset  . Hypertension Mother   . Ovarian cancer Paternal Grandmother   . Cancer Paternal Grandmother     breast, OVARIAN  . Heart disease Paternal Grandfather    History  Substance Use Topics  . Smoking status:  Never Smoker   . Smokeless tobacco: Not on file  . Alcohol Use: No   OB History    No data available     Review of Systems  Constitutional: Negative for fever, chills, diaphoresis, appetite change and fatigue.  HENT: Negative for sore throat, trouble swallowing and voice change.   Respiratory: Negative for cough and shortness of breath.   Cardiovascular: Positive for chest pain. Negative for palpitations and leg swelling.  Gastrointestinal: Positive for nausea and abdominal pain ( upper, worse in epigastrium). Negative for vomiting, diarrhea and constipation.  Musculoskeletal: Positive for back pain. Negative for myalgias, arthralgias and neck pain.  All other systems reviewed and are negative.     Allergies  Review of patient's allergies indicates no known allergies.  Home Medications   Prior to Admission medications   Medication Sig Start Date End Date Taking? Authorizing Provider  hydrOXYzine (ATARAX/VISTARIL) 25 MG tablet Take 25 mg by mouth at bedtime.   Yes Historical Provider, MD  LORYNA 3-0.02 MG tablet Take 1 tablet by mouth daily. 03/28/14  Yes Historical Provider, MD  diazepam (VALIUM) 5 MG tablet Take 1 tablet (5 mg total) by mouth every 4 (four) hours. One q4 hr prn muscle spasm Patient not taking: Reported on 04/16/2014 03/11/12   Lacretia Leigh, MD  famotidine (PEPCID) 20 MG tablet Take 1 tablet (20 mg total) by mouth 2 (two) times daily. 04/16/14   Noland Fordyce, PA-C  traMADol (ULTRAM) 50 MG tablet  Take 1 tablet (50 mg total) by mouth every 6 (six) hours as needed for pain. Patient not taking: Reported on 04/16/2014 03/11/12   Lacretia Leigh, MD   BP 126/70 mmHg  Pulse 72  Temp(Src) 98.2 F (36.8 C) (Oral)  Resp 15  Ht 5\' 2"  (1.575 m)  Wt 118 lb (53.524 kg)  BMI 21.58 kg/m2  SpO2 99%  LMP 04/02/2014 Physical Exam  Constitutional: She appears well-developed and well-nourished. No distress.  HENT:  Head: Normocephalic and atraumatic.  Eyes: Conjunctivae are  normal. No scleral icterus.  Neck: Normal range of motion. Neck supple. No JVD present. No tracheal deviation present. No thyromegaly present.  Cardiovascular: Normal rate, regular rhythm and normal heart sounds.   Pulmonary/Chest: Effort normal and breath sounds normal. No stridor. No respiratory distress. She has no wheezes. She has no rales. She exhibits no tenderness.  Abdominal: Soft. Bowel sounds are normal. She exhibits no distension and no mass. There is tenderness. There is no rebound and no guarding.  Soft, non-distended. Tenderness to upper abdomen, worse in epigastrium. No rebound, guarding or masses. No CVAT  Musculoskeletal: Normal range of motion.  Lymphadenopathy:    She has no cervical adenopathy.  Neurological: She is alert.  Skin: Skin is warm and dry. She is not diaphoretic.  Nursing note and vitals reviewed.   ED Course  Procedures (including critical care time) Labs Review Labs Reviewed  COMPREHENSIVE METABOLIC PANEL - Abnormal; Notable for the following:    Glucose, Bld 105 (*)    GFR calc non Af Amer 88 (*)    All other components within normal limits  LIPASE, BLOOD  CBC WITH DIFFERENTIAL/PLATELET  I-STAT TROPOININ, ED  I-STAT TROPOININ, ED    Imaging Review Dg Chest 2 View  04/16/2014   CLINICAL DATA:  Sternal chest pain and upper abdominal pain for 2-3 weeks. Pain increases after eating.  EXAM: CHEST  2 VIEW  COMPARISON:  Radiograph 03/11/2012  FINDINGS: Normal mediastinum and cardiac silhouette. Normal pulmonary vasculature. No evidence of effusion, infiltrate, or pneumothorax. No acute bony abnormality.  IMPRESSION: Normal chest radiograph.   Electronically Signed   By: Suzy Bouchard M.D.   On: 04/16/2014 09:21     EKG Interpretation   Date/Time:  Sunday April 16 2014 08:16:02 EDT Ventricular Rate:  113 PR Interval:  114 QRS Duration: 78 QT Interval:  312 QTC Calculation: 427 R Axis:   75 Text Interpretation:  Sinus tachycardia Right atrial  enlargement  Nonspecific ST and T wave abnormality Abnormal ECG agree. no STEMI  Confirmed by Johnney Killian, MD, Jeannie Done [phone]) on 04/16/2014 9:06:10 AM      MDM   Final diagnoses:  Epigastric pain  Chest pain, unspecified chest pain type  Nausea  Midline thoracic back pain  Bloating    Pt is a 44yo female c/o epigastric abdominal pain, radiating to back and chest.  Reports having normal HIDA scan, endoscopy as well as colonoscopy within the last several months by Dr. Collene Mares with GI.  No cardiac hx.  Pt appears well, non-toxic. NAD. Afebrile. Tenderness in epigastrium. Lungs: CTAB  Labs: unremarkable. CXR: normal  EKG: non-specific ST and T wave abnormality.  i-stat troponin: negative for elevation.  10:25 AM pt states that abdominal pain is about 6/10 at this time when she is able to relax. Labs: unremarkable at this time. Will get delta troponin at 11:39AM. No evidence of emergent abdominal process taking place at this time. CT not indicated at this time.  Delta troponin: negative for elevation.  Discussed pt with Dr. Johnney Killian who also examined pain.  Pain likely gastrointestinal in nature. Not concerned for ACS at this time.  No evidence of emergent process taking place at this time. Pt is hemodynamically stable.  Filed Vitals:   04/16/14 1045  BP: 126/70  Pulse: 72  Temp: 98.2 at 0818AM  Resp: 15    Pt safe for discharge home to f/u with PCP as well as [facility]. Will discharge pt home with pepcid. Home care instructions provided. Return precautions provided. Pt verbalized understanding and agreement with tx plan.    Noland Fordyce, PA-C 04/16/14 [place], MD 04/19/14 2217

## 2014-04-16 NOTE — ED Notes (Signed)
Pt comfortable with discharge and follow up instructions. Pt declines wheelchair, escorted to waiting area by this RN. Prescriptions x1. 

## 2014-04-16 NOTE — ED Notes (Signed)
Pt hooked up to monitor and bp and pulse ox

## 2014-04-16 NOTE — ED Notes (Signed)
Pt. Stated, Im having chest pain and upper abdominal pain for about 3 weeks.  I've had a lot of gas and bloating especiall every meal.  Went to see Dr. Collene Mares and put me on GI medicine and to see my OB Gyn doctor in April.  Its worse when I eat.

## 2014-04-20 ENCOUNTER — Ambulatory Visit (HOSPITAL_COMMUNITY)
Admission: RE | Admit: 2014-04-20 | Discharge: 2014-04-20 | Disposition: A | Discharge status: Home / Self Care | Payer: BLUE CROSS/BLUE SHIELD | Source: Ambulatory Visit | Attending: Gastroenterology | Admitting: Gastroenterology

## 2014-04-20 ENCOUNTER — Other Ambulatory Visit: Payer: Self-pay | Admitting: Gastroenterology

## 2014-04-20 ENCOUNTER — Encounter (HOSPITAL_COMMUNITY): Payer: Self-pay

## 2014-04-20 DIAGNOSIS — R1033 Periumbilical pain: Secondary | ICD-10-CM | POA: Insufficient documentation

## 2014-04-20 DIAGNOSIS — K7689 Other specified diseases of liver: Secondary | ICD-10-CM | POA: Insufficient documentation

## 2014-04-20 MED ORDER — IOHEXOL 300 MG/ML  SOLN
50.0000 mL | Freq: Once | INTRAMUSCULAR | Status: AC | PRN
  Administered 2014-04-20: 50 mL via ORAL

## 2014-04-20 MED ORDER — IOHEXOL 300 MG/ML  SOLN
100.0000 mL | Freq: Once | INTRAMUSCULAR | Status: AC | PRN
  Administered 2014-04-20: 100 mL via INTRAVENOUS

## 2014-04-22 ENCOUNTER — Encounter (HOSPITAL_COMMUNITY): Payer: Self-pay | Admitting: Emergency Medicine

## 2014-04-22 ENCOUNTER — Emergency Department (HOSPITAL_COMMUNITY): Payer: BLUE CROSS/BLUE SHIELD

## 2014-04-22 ENCOUNTER — Emergency Department (HOSPITAL_COMMUNITY)
Admission: EM | Admit: 2014-04-22 | Discharge: 2014-04-22 | Disposition: A | Discharge status: Home / Self Care | Payer: BLUE CROSS/BLUE SHIELD | Attending: Emergency Medicine | Admitting: Emergency Medicine

## 2014-04-22 DIAGNOSIS — R1013 Epigastric pain: Secondary | ICD-10-CM | POA: Insufficient documentation

## 2014-04-22 DIAGNOSIS — R110 Nausea: Secondary | ICD-10-CM | POA: Insufficient documentation

## 2014-04-22 DIAGNOSIS — Z79899 Other long term (current) drug therapy: Secondary | ICD-10-CM | POA: Diagnosis not present

## 2014-04-22 DIAGNOSIS — R52 Pain, unspecified: Secondary | ICD-10-CM

## 2014-04-22 DIAGNOSIS — F419 Anxiety disorder, unspecified: Secondary | ICD-10-CM | POA: Insufficient documentation

## 2014-04-22 DIAGNOSIS — Z86010 Personal history of colonic polyps: Secondary | ICD-10-CM | POA: Insufficient documentation

## 2014-04-22 DIAGNOSIS — R109 Unspecified abdominal pain: Secondary | ICD-10-CM | POA: Diagnosis present

## 2014-04-22 LAB — CBC WITH DIFFERENTIAL/PLATELET
BASOS PCT: 0 % (ref 0–1)
Basophils Absolute: 0 10*3/uL (ref 0.0–0.1)
EOS ABS: 0 10*3/uL (ref 0.0–0.7)
EOS PCT: 1 % (ref 0–5)
HCT: 41.1 % (ref 36.0–46.0)
HEMOGLOBIN: 14 g/dL (ref 12.0–15.0)
Lymphocytes Relative: 33 % (ref 12–46)
Lymphs Abs: 2.2 10*3/uL (ref 0.7–4.0)
MCH: 29.4 pg (ref 26.0–34.0)
MCHC: 34.1 g/dL (ref 30.0–36.0)
MCV: 86.3 fL (ref 78.0–100.0)
MONO ABS: 0.6 10*3/uL (ref 0.1–1.0)
MONOS PCT: 9 % (ref 3–12)
Neutro Abs: 3.9 10*3/uL (ref 1.7–7.7)
Neutrophils Relative %: 57 % (ref 43–77)
Platelets: 291 10*3/uL (ref 150–400)
RBC: 4.76 MIL/uL (ref 3.87–5.11)
RDW: 12.2 % (ref 11.5–15.5)
WBC: 6.7 10*3/uL (ref 4.0–10.5)

## 2014-04-22 LAB — I-STAT TROPONIN, ED: TROPONIN I, POC: 0 ng/mL (ref 0.00–0.08)

## 2014-04-22 LAB — LIPASE, BLOOD: Lipase: 23 U/L (ref 11–59)

## 2014-04-22 LAB — COMPREHENSIVE METABOLIC PANEL
ALBUMIN: 4 g/dL (ref 3.5–5.2)
ALT: 17 U/L (ref 0–35)
AST: 21 U/L (ref 0–37)
Alkaline Phosphatase: 55 U/L (ref 39–117)
Anion gap: 11 (ref 5–15)
BILIRUBIN TOTAL: 0.6 mg/dL (ref 0.3–1.2)
BUN: 10 mg/dL (ref 6–23)
CO2: 24 mmol/L (ref 19–32)
Calcium: 9.1 mg/dL (ref 8.4–10.5)
Chloride: 100 mmol/L (ref 96–112)
Creatinine, Ser: 0.85 mg/dL (ref 0.50–1.10)
GFR calc Af Amer: >90 mL/min (ref >90)
GFR, EST NON AFRICAN AMERICAN: 82 mL/min — AB (ref >90)
GLUCOSE: 81 mg/dL (ref 70–99)
Potassium: 3.9 mmol/L (ref 3.5–5.1)
Sodium: 135 mmol/L (ref 135–145)
Total Protein: 7.4 g/dL (ref 6.0–8.3)

## 2014-04-22 MED ORDER — HYDROMORPHONE HCL 1 MG/ML IJ SOLN
1.0000 mg | Freq: Once | INTRAMUSCULAR | Status: AC
  Administered 2014-04-22: 1 mg via INTRAVENOUS
  Filled 2014-04-22: qty 1

## 2014-04-22 MED ORDER — DOCUSATE SODIUM 100 MG PO CAPS: 100.0000 mg | ORAL_CAPSULE | Freq: Two times a day (BID) | ORAL | Status: DC

## 2014-04-22 MED ORDER — ONDANSETRON HCL 4 MG/2ML IJ SOLN
4.0000 mg | Freq: Once | INTRAMUSCULAR | Status: AC
  Administered 2014-04-22: 4 mg via INTRAVENOUS
  Filled 2014-04-22: qty 2

## 2014-04-22 MED ORDER — OXYCODONE-ACETAMINOPHEN 5-325 MG PO TABS: 1.0000 | ORAL_TABLET | Freq: Four times a day (QID) | ORAL | Status: DC | PRN

## 2014-04-22 NOTE — ED Notes (Addendum)
Awake. Verbally responsive. Resp even and unlabored. No audible adventitious breath sounds noted. ABC's intact. Abd soft/nondistended but tender to palpate. BS (+) and active x4 quadrants. No N/V/D reported. 

## 2014-04-22 NOTE — ED Notes (Signed)
Pt reports that she does not want to have any more XRAYS today. Will notify PA.

## 2014-04-22 NOTE — ED Provider Notes (Signed)
CSN: [id number]     Arrival date & time 04/22/14  1703 History   First MD Initiated Contact with Patient 04/22/14 1720     Chief Complaint  Patient presents with  . Abdominal Pain     (Consider location/radiation/quality/duration/timing/severity/associated sxs/prior Treatment) HPI Comments: Patient with history of cesarean section presents with continued epigastric pain and lower chest pain. Patient states that her symptoms started on 04/06/14 with a bloating sensation. She was put on probiotics by her GI physician. Over the next week her pain worsened and she was seen in emergency department on 04/16/14. At that time she had a largely negative workup, negative chest x-ray, negative troponins 2. She was referred back to GI. Patient had a CT scan performed on 3/24 which was negative. Patient is scheduled for an EGD in 2 days. She was placed on Prilosec, given tramadol for pain. Earlier this afternoon the patient's pain was so severe that she could not bear it. She states that the tramadol was not helping as much. She reports very poor oral intake over the past 3 days. No shortness of breath. Patient is concerned this is cardiac in nature. She talked with her GI physician today and was told to come to the emergency department for evaluation. No other treatments prior to arrival.   The history is provided by the patient.    Past Medical History  Diagnosis Date  . Anxiety   . Colon polyp 07/24/2006    BENIGN  . Vaginal delivery following previous caesarean section    Past Surgical History  Procedure Laterality Date  . Vaginal birth after cesarean section  2009  . Cesarean section  2002   Family History  Problem Relation Age of Onset  . Hypertension Mother   . Ovarian cancer Paternal Grandmother   . Cancer Paternal Grandmother     breast, OVARIAN  . Heart disease Paternal Grandfather    History  Substance Use Topics  . Smoking status: Never Smoker   . Smokeless tobacco: Not on file   . Alcohol Use: No   OB History    No data available     Review of Systems  Constitutional: Negative for fever.  HENT: Negative for rhinorrhea and sore throat.   Eyes: Negative for redness.  Respiratory: Negative for cough and shortness of breath.   Cardiovascular: Positive for chest pain.  Gastrointestinal: Positive for nausea and abdominal pain. Negative for vomiting and diarrhea.  Genitourinary: Negative for dysuria.  Musculoskeletal: Negative for myalgias.  Skin: Negative for rash.  Neurological: Negative for headaches.    Allergies  Oxycodone  Home Medications   Prior to Admission medications   Medication Sig Start Date End Date Taking? Authorizing Provider  famotidine (PEPCID) 20 MG tablet Take 1 tablet (20 mg total) by mouth 2 (two) times daily. 04/16/14  Yes Noland Fordyce, PA-C  hydrOXYzine (ATARAX/VISTARIL) 25 MG tablet Take 25 mg by mouth at bedtime.   Yes Historical Provider, MD  LORYNA 3-0.02 MG tablet Take 1 tablet by mouth daily. 03/28/14  Yes Historical Provider, MD  traMADol (ULTRAM) 50 MG tablet Take 1 tablet (50 mg total) by mouth every 6 (six) hours as needed for pain. 03/11/12  Yes Lacretia Leigh, MD  diazepam (VALIUM) 5 MG tablet Take 1 tablet (5 mg total) by mouth every 4 (four) hours. One q4 hr prn muscle spasm Patient not taking: Reported on 04/16/2014 03/11/12   Lacretia Leigh, MD   BP 128/81 mmHg  Pulse 91  Temp(Src) 98.2 F (  36.8 C)  Resp 16  SpO2 100%  LMP 04/02/2014   Physical Exam  Constitutional: She appears well-developed and well-nourished.  HENT:  Head: Normocephalic and atraumatic.  Mouth/Throat: Oropharynx is clear and moist.  Eyes: Conjunctivae are normal. Right eye exhibits no discharge. Left eye exhibits no discharge.  Neck: Normal range of motion. Neck supple.  Cardiovascular: Normal rate, regular rhythm and normal heart sounds.   No murmur heard. Pulmonary/Chest: Effort normal and breath sounds normal. No respiratory distress. She  has no wheezes. She has no rales.  Abdominal: Soft. Bowel sounds are normal. There is tenderness (mild, epigastric). There is no rebound and no guarding.  Neurological: She is alert.  Skin: Skin is warm and dry.  Psychiatric: She has a normal mood and affect.  Nursing note and vitals reviewed.   ED Course  Procedures (including critical care time) Labs Review Labs Reviewed  COMPREHENSIVE METABOLIC PANEL - Abnormal; Notable for the following:    GFR calc non Af Amer 82 (*)    All other components within normal limits  CBC WITH DIFFERENTIAL/PLATELET  LIPASE, BLOOD  I-STAT TROPOININ, ED    Imaging Review No results found.   EKG Interpretation None       5:44 PM Patient seen and examined. Work-up initiated. Medications ordered. Will attempt pain control. Patient is concerned that this is her heart, so will repeat work-up to reassure patient. She will likely need escalation in pain management until her EGD on Monday.   Vital signs reviewed and are as follows: BP 128/81 mmHg  Pulse 91  Temp(Src) 98.2 F (36.8 C)  Resp 16  SpO2 100%  LMP 04/02/2014  6:12 PM Patient is refusing imaging stating that she just had these done. I canceled the acute abd series.   7:55 PM Patient was informed of all results. Her pain is improved with IV meds and fluids.   Will give Percocet/colace for pain control at home. She will follow-up with Dr. Collene Mares for EGD on Monday (2 days).   The patient was urged to return to the Emergency Department immediately with worsening of current symptoms, worsening abdominal pain, persistent vomiting, blood noted in stools, fever, or any other concerns. The patient verbalized understanding.     MDM   Final diagnoses:  Epigastric pain  Uncontrolled pain   Patient presents with uncontrolled severe epigastric pain and lower chest pain. Pain was not controlled with home prescribed pain medications. She was concerned that this was cardiac in nature. Workup  tonight reveals no concern for ACS. I have low suspicion for perforation given her exam. Patient had recent CT which did not show etiology of pain. Do not feel that reimaging is indicated at this time. Her WBC count is normal. Labs are otherwise reassuring. As pain is now controlled, will discharge to home with Percocet to get her through the weekend and to her EGD on Monday. Discussed appropriate return instructions. She will continue PPI.     Carlisle Cater, PA-C 04/22/14 2000  Virgel Manifold, MD 04/22/14 2312

## 2014-04-22 NOTE — ED Notes (Signed)
Pt c/o mid epigastric pain which radiates around her ribs. Describes pain as constant, intense gripping pain. Seen at [facility] for same last week. Saw her GI doc on Thursday and was recommended for EGD on Monday. Pt is taking Ultram for pain, but states it is no longer working well. Denies other alleviating factors. States pain is worse at night and after eating, but pt has had decreased appetite. Pt also states the has burning upper chest pain which started today and radiates around chest. Rates pain 7/10. Denies N/V/D. Last BM 2 days ago.

## 2014-04-22 NOTE — Discharge Instructions (Signed)
Please read and follow all provided instructions.  Your diagnoses today include:  1. Epigastric pain   2. Uncontrolled pain     Tests performed today include:  Blood counts and electrolytes  Blood tests to check liver and kidney function  Blood tests to check pancreas function  EKG - no sign of heart attack  Troponin - no sign of heart muscle damage  Vital signs. See below for your results today.   Medications prescribed:   Percocet (oxycodone/acetaminophen) - narcotic pain medication  DO NOT drive or perform any activities that require you to be awake and alert because this medicine can make you drowsy. BE VERY CAREFUL not to take multiple medicines containing Tylenol (also called acetaminophen). Doing so can lead to an overdose which can damage your liver and cause liver failure and possibly death.   Colace - stool softener  This medication can be found over-the-counter.    Take any prescribed medications only as directed.  Home care instructions:   Follow any educational materials contained in this packet.  Follow-up instructions: Please follow-up with Dr. Collene Mares for your EGD in the 2 days for further evaluation of your symptoms.    Return instructions:  SEEK IMMEDIATE MEDICAL ATTENTION IF:  The pain does not go away or becomes severe   A temperature above 101F develops   Repeated vomiting occurs (multiple episodes)   The pain becomes localized to portions of the abdomen. The right side could possibly be appendicitis. In an adult, the left lower portion of the abdomen could be colitis or diverticulitis.   Blood is being passed in stools or vomit (bright red or black tarry stools)   You develop chest pain, difficulty breathing, dizziness or fainting, or become confused, poorly responsive, or inconsolable (young children)  If you have any other emergent concerns regarding your health  Additional Information: Abdominal (belly) pain can be caused by many  things. Your caregiver performed an examination and possibly ordered blood/urine tests and imaging (CT scan, x-rays, ultrasound). Many cases can be observed and treated at home after initial evaluation in the emergency department. Even though you are being discharged home, abdominal pain can be unpredictable. Therefore, you need a repeated exam if your pain does not resolve, returns, or worsens. Most patients with abdominal pain don't have to be admitted to the hospital or have surgery, but serious problems like appendicitis and gallbladder attacks can start out as nonspecific pain. Many abdominal conditions cannot be diagnosed in one visit, so follow-up evaluations are very important.  Your vital signs today were: BP 128/81 mmHg   Pulse 91   Temp(Src) 98.2 F (36.8 C)   Resp 16   SpO2 100%   LMP 04/02/2014 If your blood pressure (bp) was elevated above 135/85 this visit, please have this repeated by your doctor within one month. --------------

## 2014-06-12 ENCOUNTER — Emergency Department (HOSPITAL_COMMUNITY)
Admission: EM | Admit: 2014-06-12 | Discharge: 2014-06-12 | Disposition: A | Discharge status: Home / Self Care | Payer: BLUE CROSS/BLUE SHIELD | Attending: Emergency Medicine | Admitting: Emergency Medicine

## 2014-06-12 ENCOUNTER — Encounter (HOSPITAL_COMMUNITY): Payer: Self-pay | Admitting: Emergency Medicine

## 2014-06-12 DIAGNOSIS — F419 Anxiety disorder, unspecified: Secondary | ICD-10-CM | POA: Insufficient documentation

## 2014-06-12 DIAGNOSIS — Z79899 Other long term (current) drug therapy: Secondary | ICD-10-CM | POA: Diagnosis not present

## 2014-06-12 DIAGNOSIS — R630 Anorexia: Secondary | ICD-10-CM | POA: Insufficient documentation

## 2014-06-12 DIAGNOSIS — Z3202 Encounter for pregnancy test, result negative: Secondary | ICD-10-CM | POA: Insufficient documentation

## 2014-06-12 DIAGNOSIS — R1013 Epigastric pain: Secondary | ICD-10-CM | POA: Diagnosis not present

## 2014-06-12 DIAGNOSIS — R1011 Right upper quadrant pain: Secondary | ICD-10-CM | POA: Insufficient documentation

## 2014-06-12 DIAGNOSIS — Z86010 Personal history of colonic polyps: Secondary | ICD-10-CM | POA: Insufficient documentation

## 2014-06-12 DIAGNOSIS — R112 Nausea with vomiting, unspecified: Secondary | ICD-10-CM | POA: Diagnosis not present

## 2014-06-12 DIAGNOSIS — R197 Diarrhea, unspecified: Secondary | ICD-10-CM | POA: Insufficient documentation

## 2014-06-12 LAB — URINE MICROSCOPIC-ADD ON

## 2014-06-12 LAB — CBC WITH DIFFERENTIAL/PLATELET
Basophils Absolute: 0 10*3/uL (ref 0.0–0.1)
Basophils Relative: 0 % (ref 0–1)
Eosinophils Absolute: 0.1 10*3/uL (ref 0.0–0.7)
Eosinophils Relative: 1 % (ref 0–5)
HEMATOCRIT: 39.2 % (ref 36.0–46.0)
Hemoglobin: 13.1 g/dL (ref 12.0–15.0)
LYMPHS PCT: 26 % (ref 12–46)
Lymphs Abs: 2.1 10*3/uL (ref 0.7–4.0)
MCH: 28.7 pg (ref 26.0–34.0)
MCHC: 33.4 g/dL (ref 30.0–36.0)
MCV: 86 fL (ref 78.0–100.0)
Monocytes Absolute: 0.5 10*3/uL (ref 0.1–1.0)
Monocytes Relative: 6 % (ref 3–12)
Neutro Abs: 5.4 10*3/uL (ref 1.7–7.7)
Neutrophils Relative %: 67 % (ref 43–77)
Platelets: 251 10*3/uL (ref 150–400)
RBC: 4.56 MIL/uL (ref 3.87–5.11)
RDW: 12.5 % (ref 11.5–15.5)
WBC: 8.1 10*3/uL (ref 4.0–10.5)

## 2014-06-12 LAB — COMPREHENSIVE METABOLIC PANEL
ALBUMIN: 3.6 g/dL (ref 3.5–5.0)
ALT: 20 U/L (ref 14–54)
AST: 22 U/L (ref 15–41)
Alkaline Phosphatase: 54 U/L (ref 38–126)
Anion gap: 15 (ref 5–15)
BILIRUBIN TOTAL: 0.9 mg/dL (ref 0.3–1.2)
BUN: 10 mg/dL (ref 6–20)
CHLORIDE: 102 mmol/L (ref 101–111)
CO2: 20 mmol/L — ABNORMAL LOW (ref 22–32)
Calcium: 8.9 mg/dL (ref 8.9–10.3)
Creatinine, Ser: 0.97 mg/dL (ref 0.44–1.00)
GFR calc non Af Amer: >60 mL/min (ref >60)
GLUCOSE: 74 mg/dL (ref 65–99)
POTASSIUM: 3.7 mmol/L (ref 3.5–5.1)
Sodium: 137 mmol/L (ref 135–145)
Total Protein: 6.4 g/dL — ABNORMAL LOW (ref 6.5–8.1)

## 2014-06-12 LAB — URINALYSIS, ROUTINE W REFLEX MICROSCOPIC
GLUCOSE, UA: NEGATIVE mg/dL
Ketones, ur: >80 mg/dL — AB
Leukocytes, UA: NEGATIVE
NITRITE: NEGATIVE
PH: 5 (ref 5.0–8.0)
Protein, ur: NEGATIVE mg/dL
Specific Gravity, Urine: 1.018 (ref 1.005–1.030)
Urobilinogen, UA: 0.2 mg/dL (ref 0.0–1.0)

## 2014-06-12 LAB — LIPASE, BLOOD: Lipase: 18 U/L — ABNORMAL LOW (ref 22–51)

## 2014-06-12 LAB — POC URINE PREG, ED: Preg Test, Ur: NEGATIVE

## 2014-06-12 LAB — I-STAT TROPONIN, ED: TROPONIN I, POC: 0 ng/mL (ref 0.00–0.08)

## 2014-06-12 LAB — CK: Total CK: 46 U/L (ref 38–234)

## 2014-06-12 MED ORDER — ONDANSETRON HCL 4 MG/2ML IJ SOLN
4.0000 mg | Freq: Once | INTRAMUSCULAR | Status: AC
  Administered 2014-06-12: 4 mg via INTRAVENOUS
  Filled 2014-06-12: qty 2

## 2014-06-12 MED ORDER — HYDROMORPHONE HCL 1 MG/ML IJ SOLN
1.0000 mg | Freq: Once | INTRAMUSCULAR | Status: AC
  Administered 2014-06-12: 1 mg via INTRAVENOUS
  Filled 2014-06-12: qty 1

## 2014-06-12 MED ORDER — SODIUM CHLORIDE 0.9 % IV BOLUS (SEPSIS)
1000.0000 mL | Freq: Once | INTRAVENOUS | Status: AC
  Administered 2014-06-12: 1000 mL via INTRAVENOUS

## 2014-06-12 NOTE — ED Provider Notes (Signed)
CSN: [id number]     Arrival date & time 06/12/14  1025 History   First MD Initiated Contact with Patient 06/12/14 1028     Chief Complaint  Patient presents with  . Abdominal Pain     (Consider location/radiation/quality/duration/timing/severity/associated sxs/prior Treatment) Patient is a 44 y.o. female presenting with abdominal pain.  Abdominal Pain Pain location:  RUQ and epigastric Pain quality: sharp   Pain radiates to:  Does not radiate Pain severity:  Severe Onset quality:  Gradual Duration:  1 week Timing:  Intermittent Progression:  Waxing and waning Chronicity:  Recurrent Context: eating   Context: not alcohol use   Relieved by:  Nothing Worsened by:  Movement, palpation and eating Associated symptoms: anorexia, diarrhea, nausea and vomiting   Associated symptoms: no dysuria and no fever     Past Medical History  Diagnosis Date  . Anxiety   . Colon polyp 07/24/2006    BENIGN  . Vaginal delivery following previous caesarean section    Past Surgical History  Procedure Laterality Date  . Vaginal birth after cesarean section  2009  . Cesarean section  2002   Family History  Problem Relation Age of Onset  . Hypertension Mother   . Ovarian cancer Paternal Grandmother   . Cancer Paternal Grandmother     breast, OVARIAN  . Heart disease Paternal Grandfather    History  Substance Use Topics  . Smoking status: Never Smoker   . Smokeless tobacco: Not on file  . Alcohol Use: No   OB History    No data available     Review of Systems  Constitutional: Negative for fever.  Gastrointestinal: Positive for nausea, vomiting, abdominal pain, diarrhea and anorexia.  Genitourinary: Negative for dysuria.  All other systems reviewed and are negative.     Allergies  Oxycodone  Home Medications   Prior to Admission medications   Medication Sig Start Date End Date Taking? Authorizing Provider  diazepam (VALIUM) 5 MG tablet Take 5 mg by mouth at bedtime.  04/24/14  Yes Historical Provider, MD  HYDROmorphone (DILAUDID) 2 MG tablet Take 2 mg by mouth every 8 (eight) hours as needed. for pain 04/24/14  Yes Historical Provider, MD  hydrOXYzine (ATARAX/VISTARIL) 25 MG tablet Take 25 mg by mouth at bedtime.   Yes Historical Provider, MD  LORYNA 3-0.02 MG tablet Take 1 tablet by mouth daily. 03/28/14  Yes Historical Provider, MD  traMADol (ULTRAM) 50 MG tablet Take 1 tablet (50 mg total) by mouth every 6 (six) hours as needed for pain. 03/11/12  Yes Lacretia Leigh, MD  docusate sodium (COLACE) 100 MG capsule Take 1 capsule (100 mg total) by mouth every 12 (twelve) hours. Patient not taking: Reported on 06/12/2014 04/22/14   Carlisle Cater, PA-C  famotidine (PEPCID) 20 MG tablet Take 1 tablet (20 mg total) by mouth 2 (two) times daily. Patient not taking: Reported on 06/12/2014 04/16/14   Noland Fordyce, PA-C  oxyCODONE-acetaminophen (PERCOCET/ROXICET) 5-325 MG per tablet Take 1-2 tablets by mouth every 6 (six) hours as needed for severe pain. Patient not taking: Reported on 06/12/2014 04/22/14   Carlisle Cater, PA-C   BP 106/63 mmHg  Pulse 74  Temp(Src) 98.6 F (37 C) (Oral)  Resp 16  SpO2 98%  LMP 05/29/2014 (Approximate) Physical Exam  Constitutional: She is oriented to person, place, and time. She appears well-developed and well-nourished.  HENT:  Head: Normocephalic and atraumatic.  Right Ear: External ear normal.  Left Ear: External ear normal.  Eyes: Conjunctivae and  EOM are normal. Pupils are equal, round, and reactive to light.  Neck: Normal range of motion. Neck supple.  Cardiovascular: Normal rate, regular rhythm, normal heart sounds and intact distal pulses.   Pulmonary/Chest: Effort normal and breath sounds normal.  Abdominal: Soft. Bowel sounds are normal. There is tenderness in the right upper quadrant and epigastric area.  Musculoskeletal: Normal range of motion.  Neurological: She is alert and oriented to person, place, and time.  Skin: Skin  is warm and dry.  Vitals reviewed.   ED Course  Procedures (including critical care time) Labs Review Labs Reviewed  COMPREHENSIVE METABOLIC PANEL - Abnormal; Notable for the following:    CO2 20 (*)    Total Protein 6.4 (*)    All other components within normal limits  LIPASE, BLOOD - Abnormal; Notable for the following:    Lipase 18 (*)    All other components within normal limits  URINALYSIS, ROUTINE W REFLEX MICROSCOPIC - Abnormal; Notable for the following:    APPearance CLOUDY (*)    Hgb urine dipstick MODERATE (*)    Bilirubin Urine SMALL (*)    Ketones, ur >80 (*)    All other components within normal limits  URINE MICROSCOPIC-ADD ON - Abnormal; Notable for the following:    Squamous Epithelial / LPF MANY (*)    Bacteria, UA FEW (*)    All other components within normal limits  CBC WITH DIFFERENTIAL/PLATELET  CK  I-STAT TROPOININ, ED  POC URINE PREG, ED    Imaging Review No results found.   EKG Interpretation None      MDM   Final diagnoses:  Epigastric pain    44 y.o. female with pertinent PMH of chronic abd pain with multiple negative testing including HIDA and endoscopy presents with recurrent acute on chronic abd pain, identical to prior.  Exam today as above.  No urinary symptoms.   Wu as above with hgb of urine, no blood.  Checked CK which was negative.  No casts or other microscopic abnormality.  Suspect chronic pain with dehydration.  Pt given NS bolus with improvement of malaise.  Will have pt fu with PCP.   I have reviewed all laboratory and imaging studies if ordered as above  1. Epigastric pain         Debby Freiberg, MD 06/14/14 [phone]

## 2014-06-12 NOTE — Discharge Instructions (Signed)

## 2014-06-12 NOTE — ED Notes (Addendum)
Pt reports chronic abdominal pain. Follows up with Dr. Collene Mares. Yesterday was only able to eat broth. Reports nawing, squeezing, burning pain after eating. Points to upper abdomen. Last bowel movement 3 days ago. Denies fever. C/o nausea and vomiting x1 this AM. Pt NAd at present.

## 2014-06-14 ENCOUNTER — Telehealth: Payer: Self-pay | Admitting: Gastroenterology

## 2014-06-14 NOTE — Telephone Encounter (Signed)
Received records from [facility] forwarded to Dr. Owens Loffler 06/14/14 fbg.

## 2014-06-21 ENCOUNTER — Telehealth: Payer: Self-pay | Admitting: Gastroenterology

## 2014-06-22 ENCOUNTER — Encounter (HOSPITAL_COMMUNITY): Payer: Self-pay | Admitting: Neurology

## 2014-06-22 ENCOUNTER — Emergency Department (HOSPITAL_COMMUNITY): Payer: BLUE CROSS/BLUE SHIELD

## 2014-06-22 ENCOUNTER — Emergency Department (HOSPITAL_COMMUNITY)
Admission: EM | Admit: 2014-06-22 | Discharge: 2014-06-23 | Disposition: A | Discharge status: Home / Self Care | Payer: BLUE CROSS/BLUE SHIELD | Attending: Emergency Medicine | Admitting: Emergency Medicine

## 2014-06-22 DIAGNOSIS — G8929 Other chronic pain: Secondary | ICD-10-CM

## 2014-06-22 DIAGNOSIS — Z86010 Personal history of colonic polyps: Secondary | ICD-10-CM | POA: Insufficient documentation

## 2014-06-22 DIAGNOSIS — R1011 Right upper quadrant pain: Secondary | ICD-10-CM | POA: Insufficient documentation

## 2014-06-22 DIAGNOSIS — R1013 Epigastric pain: Secondary | ICD-10-CM

## 2014-06-22 DIAGNOSIS — F419 Anxiety disorder, unspecified: Secondary | ICD-10-CM | POA: Insufficient documentation

## 2014-06-22 DIAGNOSIS — Z79899 Other long term (current) drug therapy: Secondary | ICD-10-CM | POA: Diagnosis not present

## 2014-06-22 DIAGNOSIS — Z3202 Encounter for pregnancy test, result negative: Secondary | ICD-10-CM | POA: Insufficient documentation

## 2014-06-22 DIAGNOSIS — R109 Unspecified abdominal pain: Secondary | ICD-10-CM

## 2014-06-22 LAB — URINALYSIS, ROUTINE W REFLEX MICROSCOPIC
Bilirubin Urine: NEGATIVE
Glucose, UA: NEGATIVE mg/dL
Hgb urine dipstick: NEGATIVE
Ketones, ur: NEGATIVE mg/dL
Nitrite: NEGATIVE
Protein, ur: NEGATIVE mg/dL
Specific Gravity, Urine: 1.008 (ref 1.005–1.030)
Urobilinogen, UA: 0.2 mg/dL (ref 0.0–1.0)
pH: 6 (ref 5.0–8.0)

## 2014-06-22 LAB — CBC WITH DIFFERENTIAL/PLATELET
Basophils Absolute: 0 10*3/uL (ref 0.0–0.1)
Basophils Relative: 0 % (ref 0–1)
Eosinophils Absolute: 0.1 10*3/uL (ref 0.0–0.7)
Eosinophils Relative: 2 % (ref 0–5)
HCT: 42.2 % (ref 36.0–46.0)
Hemoglobin: 14.3 g/dL (ref 12.0–15.0)
Lymphocytes Relative: 33 % (ref 12–46)
Lymphs Abs: 2.1 10*3/uL (ref 0.7–4.0)
MCH: 28.9 pg (ref 26.0–34.0)
MCHC: 33.9 g/dL (ref 30.0–36.0)
MCV: 85.4 fL (ref 78.0–100.0)
Monocytes Absolute: 0.6 10*3/uL (ref 0.1–1.0)
Monocytes Relative: 9 % (ref 3–12)
Neutro Abs: 3.6 10*3/uL (ref 1.7–7.7)
Neutrophils Relative %: 56 % (ref 43–77)
Platelets: 285 10*3/uL (ref 150–400)
RBC: 4.94 MIL/uL (ref 3.87–5.11)
RDW: 12.6 % (ref 11.5–15.5)
WBC: 6.4 10*3/uL (ref 4.0–10.5)

## 2014-06-22 LAB — COMPREHENSIVE METABOLIC PANEL
ALBUMIN: 3.9 g/dL (ref 3.5–5.0)
ALT: 19 U/L (ref 14–54)
ANION GAP: 11 (ref 5–15)
AST: 22 U/L (ref 15–41)
Alkaline Phosphatase: 49 U/L (ref 38–126)
BILIRUBIN TOTAL: 0.4 mg/dL (ref 0.3–1.2)
BUN: 11 mg/dL (ref 6–20)
CALCIUM: 9.4 mg/dL (ref 8.9–10.3)
CO2: 25 mmol/L (ref 22–32)
Chloride: 100 mmol/L — ABNORMAL LOW (ref 101–111)
Creatinine, Ser: 0.85 mg/dL (ref 0.44–1.00)
GLUCOSE: 126 mg/dL — AB (ref 65–99)
Potassium: 4.3 mmol/L (ref 3.5–5.1)
Sodium: 136 mmol/L (ref 135–145)
TOTAL PROTEIN: 6.8 g/dL (ref 6.5–8.1)

## 2014-06-22 LAB — URINE MICROSCOPIC-ADD ON

## 2014-06-22 LAB — POC URINE PREG, ED: Preg Test, Ur: NEGATIVE

## 2014-06-22 LAB — LIPASE, BLOOD: Lipase: 21 U/L — ABNORMAL LOW (ref 22–51)

## 2014-06-22 MED ORDER — PANTOPRAZOLE SODIUM 40 MG IV SOLR
40.0000 mg | Freq: Once | INTRAVENOUS | Status: AC
  Administered 2014-06-22: 40 mg via INTRAVENOUS
  Filled 2014-06-22: qty 40

## 2014-06-22 MED ORDER — HYDROMORPHONE HCL 1 MG/ML IJ SOLN
1.0000 mg | Freq: Once | INTRAMUSCULAR | Status: AC
  Administered 2014-06-22: 1 mg via INTRAVENOUS
  Filled 2014-06-22: qty 1

## 2014-06-22 MED ORDER — ONDANSETRON 4 MG PO TBDP: ORAL_TABLET | ORAL | Status: DC

## 2014-06-22 MED ORDER — SUCRALFATE 1 G PO TABS
1.0000 g | ORAL_TABLET | Freq: Once | ORAL | Status: AC
  Administered 2014-06-22: 1 g via ORAL
  Filled 2014-06-22: qty 1

## 2014-06-22 MED ORDER — PANTOPRAZOLE SODIUM 20 MG PO TBEC: 20.0000 mg | DELAYED_RELEASE_TABLET | Freq: Every day | ORAL | Status: DC

## 2014-06-22 NOTE — Discharge Instructions (Signed)

## 2014-06-22 NOTE — ED Provider Notes (Signed)
CSN: [id number]     Arrival date & time 06/22/14  1647 History   First MD Initiated Contact with Patient 06/22/14 2022     Chief Complaint  Patient presents with  . Abdominal Pain     (Consider location/radiation/quality/duration/timing/severity/associated sxs/prior Treatment) HPI Patient has a problem with recurrent episodic epigastric and right upper quadrant pain. She reports and it is intense and both cramping and stabbing in nature. It also goes in through to her back. She states that due to pain with eating she has gotten to the point that she only takes liquids and Ensure. The patient changed gastroenterologists and has not been able to get a follow-up appointment scheduled for reevaluation. In the past she has had a hydroscan, endoscopies and CT scans. She has not had fever. No diarrhea or constipation. Past Medical History  Diagnosis Date  . Anxiety   . Colon polyp 07/24/2006    BENIGN  . Vaginal delivery following previous caesarean section    Past Surgical History  Procedure Laterality Date  . Vaginal birth after cesarean section  2009  . Cesarean section  2002   Family History  Problem Relation Age of Onset  . Hypertension Mother   . Ovarian cancer Paternal Grandmother   . Cancer Paternal Grandmother     breast, OVARIAN  . Heart disease Paternal Grandfather    History  Substance Use Topics  . Smoking status: Never Smoker   . Smokeless tobacco: Not on file  . Alcohol Use: No   OB History    No data available     Review of Systems 10 Systems reviewed and are negative for acute change except as noted in the HPI.    Allergies  Oxycodone  Home Medications   Prior to Admission medications   Medication Sig Start Date End Date Taking? Authorizing Provider  diazepam (VALIUM) 5 MG tablet Take 5 mg by mouth at bedtime. 04/24/14   Historical Provider, MD  docusate sodium (COLACE) 100 MG capsule Take 1 capsule (100 mg total) by mouth every 12 (twelve)  hours. Patient not taking: Reported on 06/12/2014 04/22/14   Carlisle Cater, PA-C  famotidine (PEPCID) 20 MG tablet Take 1 tablet (20 mg total) by mouth 2 (two) times daily. Patient not taking: Reported on 06/12/2014 04/16/14   Noland Fordyce, PA-C  HYDROmorphone (DILAUDID) 2 MG tablet Take 2 mg by mouth every 8 (eight) hours as needed. for pain 04/24/14   Historical Provider, MD  hydrOXYzine (ATARAX/VISTARIL) 25 MG tablet Take 25 mg by mouth at bedtime.    Historical Provider, MD  LORYNA 3-0.02 MG tablet Take 1 tablet by mouth daily. 03/28/14   Historical Provider, MD  ondansetron (ZOFRAN ODT) 4 MG disintegrating tablet 4mg  ODT q4 hours prn nausea/vomit 06/22/14   Charlesetta Shanks, MD  ondansetron (ZOFRAN ODT) 4 MG disintegrating tablet Take 1 tablet (4 mg total) by mouth every 4 (four) hours as needed for nausea or vomiting. 06/23/14   Charlesetta Shanks, MD  oxyCODONE-acetaminophen (PERCOCET/ROXICET) 5-325 MG per tablet Take 1-2 tablets by mouth every 6 (six) hours as needed for severe pain. Patient not taking: Reported on 06/12/2014 04/22/14   Carlisle Cater, PA-C  pantoprazole (PROTONIX) 20 MG tablet Take 1 tablet (20 mg total) by mouth daily. 06/22/14   Charlesetta Shanks, MD  pantoprazole (PROTONIX) 20 MG tablet Take 1 tablet (20 mg total) by mouth daily. 06/23/14   Charlesetta Shanks, MD  traMADol (ULTRAM) 50 MG tablet Take 1 tablet (50 mg total) by mouth every  6 (six) hours as needed for pain. 03/11/12   Lacretia Leigh, MD   BP 100/52 mmHg  Pulse 61  Temp(Src) 98.2 F (36.8 C) (Oral)  Resp 19  Wt 104 lb 9 oz (47.429 kg)  SpO2 99%  LMP 05/29/2014 (Approximate) Physical Exam  Constitutional: She is oriented to person, place, and time. She appears well-developed and well-nourished.  HENT:  Head: Normocephalic and atraumatic.  Eyes: EOM are normal. Pupils are equal, round, and reactive to light.  Neck: Neck supple.  Cardiovascular: Normal rate, regular rhythm, normal heart sounds and intact distal pulses.    Pulmonary/Chest: Effort normal and breath sounds normal.  Abdominal: Soft. Bowel sounds are normal. She exhibits no distension. There is tenderness.  Endorses severe right upper quadrant and epigastric pain. No guarding or rebound. No palpable mass  Musculoskeletal: Normal range of motion. She exhibits no edema.  Neurological: She is alert and oriented to person, place, and time. She has normal strength. Coordination normal. GCS eye subscore is 4. GCS verbal subscore is 5. GCS motor subscore is 6.  Skin: Skin is warm, dry and intact.  Psychiatric: She has a normal mood and affect.    ED Course  Procedures (including critical care time) Labs Review Labs Reviewed  COMPREHENSIVE METABOLIC PANEL - Abnormal; Notable for the following:    Chloride 100 (*)    Glucose, Bld 126 (*)    All other components within normal limits  LIPASE, BLOOD - Abnormal; Notable for the following:    Lipase 21 (*)    All other components within normal limits  URINALYSIS, ROUTINE W REFLEX MICROSCOPIC (NOT AT [facility]) - Abnormal; Notable for the following:    APPearance CLOUDY (*)    Leukocytes, UA SMALL (*)    All other components within normal limits  URINE MICROSCOPIC-ADD ON - Abnormal; Notable for the following:    Squamous Epithelial / LPF MANY (*)    Bacteria, UA FEW (*)    All other components within normal limits  CBC WITH DIFFERENTIAL/PLATELET  POC URINE PREG, ED    Imaging Review US Abdomen Complete  06/22/2014   CLINICAL DATA:  Abdominal pain.  EXAM: ULTRASOUND ABDOMEN COMPLETE  COMPARISON:  CT abdomen pelvis- 04/20/2014  FINDINGS: Gallbladder: Sonographically normal. No echogenic gallstones or gall sludge. No gallbladder wall thickening or pericholecystic fluid. Negative sonographic Murphy's sign.  Common bile duct: Diameter: Normal in size measuring 3.9 mm in diameter  Liver: Homogeneous hepatic echotexture. No discrete hepatic lesions. No definite evidence of intrahepatic biliary ductal dilatation.  No ascites.  IVC: No abnormality visualized.  Pancreas: Limited visualization of the pancreatic head and neck is normal. Visualization of the pancreatic body and tail is obscured by bowel gas.  Spleen: Normal in size measuring 9.7 cm in length.  Right Kidney: Normal cortical thickness, echogenicity and size, measuring 10.0 cm in length. No focal renal lesions. No echogenic renal stones. No urinary obstruction.  Left Kidney: Normal cortical thickness, echogenicity and size, measuring 10.2 cm in length. No focal renal lesions. No echogenic renal stones. No urinary obstruction.  Abdominal aorta: No aneurysm visualized.  Other findings: None.  IMPRESSION: Normal abdominal ultrasound. No explanation for patient's abdominal pain. Specifically, no evidence of cholelithiasis urinary obstruction.   Electronically Signed   By: Sandi Mariscal M.D.   On: 06/22/2014 22:58     EKG Interpretation None      MDM   Final diagnoses:  Chronic abdominal pain  Dyspepsia   Currently this appears consistent with the  patient's chronic intermittent pain exacerbations. She has had diagnostic workup including CT scan and endoscopy without specific identified etiology. She has also had a hydroscan as these attacks have sounded a lot like biliary colic. I did not repeat CT as one has been done within the past 2 months and at this point in time this did not appear to be an acutely surgical abdomen. Ultrasound was repeated and no specific abnormality is identified. Kidney function, hemoglobin and LFTs are normal. The pain location and quality seems suggestive of severe GERD\dyspepsia and I have advised the patient to initiate a daily PPI. She reports to me and shows me a prescription bottle for prior treatment with hydromorphone for pain control. At this time with nonspecific abdominal pain that appears to be chronic and intermittent with no conclusive evidence of a specific disease process, I did not feel that narcotic pain management  was appropriate and thus am advising for conservative measures. The patient described changing her gastroenterologist and not having been able to get her appointment established in a timely fashion, I have forwarded a note to Dr. Ardis Hughs of [facility] regarding the patient's emergency department visit to help the patient in her follow-up plan.    Charlesetta Shanks, MD 06/24/14 [phone]

## 2014-06-22 NOTE — ED Notes (Addendum)
Pt reports middle abd pain radiation to right flank for several months. Reports is out of pain meds and can't get in with GI doctor. Reports she can't eat but has been drinking ensure. Took hydromorphone pills for pain at 1500 today.

## 2014-06-23 MED ORDER — PANTOPRAZOLE SODIUM 20 MG PO TBEC: 20.0000 mg | DELAYED_RELEASE_TABLET | Freq: Every day | ORAL | Status: DC

## 2014-06-23 MED ORDER — ONDANSETRON 4 MG PO TBDP: 4.0000 mg | ORAL_TABLET | ORAL | Status: DC | PRN

## 2014-06-23 NOTE — Telephone Encounter (Signed)
Records were denied by Dr. Ardis Hughs.  Advised pt.

## 2014-07-07 HISTORY — PX: CHOLECYSTECTOMY, LAPAROSCOPIC: SHX56

## 2014-07-21 ENCOUNTER — Ambulatory Visit
Admission: RE | Admit: 2014-07-21 | Discharge: 2014-07-21 | Disposition: A | Discharge status: Home / Self Care | Payer: BLUE CROSS/BLUE SHIELD | Source: Ambulatory Visit | Attending: Internal Medicine | Admitting: Internal Medicine

## 2014-07-21 ENCOUNTER — Other Ambulatory Visit: Payer: Self-pay | Admitting: Internal Medicine

## 2014-07-21 DIAGNOSIS — R0789 Other chest pain: Secondary | ICD-10-CM

## 2014-07-21 DIAGNOSIS — R7989 Other specified abnormal findings of blood chemistry: Secondary | ICD-10-CM

## 2014-07-21 MED ORDER — IOPAMIDOL (ISOVUE-370) INJECTION 76%
100.0000 mL | Freq: Once | INTRAVENOUS | Status: AC | PRN
  Administered 2014-07-21: 100 mL via INTRAVENOUS

## 2014-08-21 ENCOUNTER — Ambulatory Visit: Payer: BLUE CROSS/BLUE SHIELD | Admitting: Gastroenterology

## 2015-01-18 ENCOUNTER — Encounter (HOSPITAL_COMMUNITY): Payer: Self-pay | Admitting: Emergency Medicine

## 2015-01-18 ENCOUNTER — Emergency Department (HOSPITAL_COMMUNITY)
Admission: EM | Admit: 2015-01-18 | Discharge: 2015-01-18 | Disposition: A | Discharge status: Home / Self Care | Payer: BLUE CROSS/BLUE SHIELD | Attending: Emergency Medicine | Admitting: Emergency Medicine

## 2015-01-18 DIAGNOSIS — Z86010 Personal history of colonic polyps: Secondary | ICD-10-CM | POA: Diagnosis not present

## 2015-01-18 DIAGNOSIS — Z9889 Other specified postprocedural states: Secondary | ICD-10-CM | POA: Insufficient documentation

## 2015-01-18 DIAGNOSIS — Z3202 Encounter for pregnancy test, result negative: Secondary | ICD-10-CM | POA: Diagnosis not present

## 2015-01-18 DIAGNOSIS — F419 Anxiety disorder, unspecified: Secondary | ICD-10-CM | POA: Insufficient documentation

## 2015-01-18 DIAGNOSIS — R112 Nausea with vomiting, unspecified: Secondary | ICD-10-CM

## 2015-01-18 DIAGNOSIS — Z9049 Acquired absence of other specified parts of digestive tract: Secondary | ICD-10-CM | POA: Diagnosis not present

## 2015-01-18 DIAGNOSIS — Z79899 Other long term (current) drug therapy: Secondary | ICD-10-CM | POA: Insufficient documentation

## 2015-01-18 DIAGNOSIS — R1013 Epigastric pain: Secondary | ICD-10-CM

## 2015-01-18 DIAGNOSIS — Z793 Long term (current) use of hormonal contraceptives: Secondary | ICD-10-CM | POA: Insufficient documentation

## 2015-01-18 LAB — COMPREHENSIVE METABOLIC PANEL
ALK PHOS: 57 U/L (ref 38–126)
ALT: 15 U/L (ref 14–54)
ANION GAP: 11 (ref 5–15)
AST: 17 U/L (ref 15–41)
Albumin: 4.2 g/dL (ref 3.5–5.0)
BILIRUBIN TOTAL: 0.6 mg/dL (ref 0.3–1.2)
BUN: 15 mg/dL (ref 6–20)
CALCIUM: 9.1 mg/dL (ref 8.9–10.3)
CO2: 25 mmol/L (ref 22–32)
Chloride: 103 mmol/L (ref 101–111)
Creatinine, Ser: 0.72 mg/dL (ref 0.44–1.00)
GFR calc Af Amer: >60 mL/min (ref >60)
GLUCOSE: 108 mg/dL — AB (ref 65–99)
POTASSIUM: 3.8 mmol/L (ref 3.5–5.1)
Sodium: 139 mmol/L (ref 135–145)
Total Protein: 7.3 g/dL (ref 6.5–8.1)

## 2015-01-18 LAB — I-STAT BETA HCG BLOOD, ED (MC, WL, AP ONLY): I-stat hCG, quantitative: <5 m[IU]/mL (ref <5)

## 2015-01-18 LAB — URINALYSIS, ROUTINE W REFLEX MICROSCOPIC
BILIRUBIN URINE: NEGATIVE
Glucose, UA: NEGATIVE mg/dL
Ketones, ur: 15 mg/dL — AB
LEUKOCYTES UA: NEGATIVE
NITRITE: NEGATIVE
PH: 6 (ref 5.0–8.0)
PROTEIN: NEGATIVE mg/dL
Specific Gravity, Urine: 1.023 (ref 1.005–1.030)

## 2015-01-18 LAB — CBC
HEMATOCRIT: 39.8 % (ref 36.0–46.0)
Hemoglobin: 13.4 g/dL (ref 12.0–15.0)
MCH: 29.4 pg (ref 26.0–34.0)
MCHC: 33.7 g/dL (ref 30.0–36.0)
MCV: 87.3 fL (ref 78.0–100.0)
Platelets: 280 10*3/uL (ref 150–400)
RBC: 4.56 MIL/uL (ref 3.87–5.11)
RDW: 13 % (ref 11.5–15.5)
WBC: 10.8 10*3/uL — AB (ref 4.0–10.5)

## 2015-01-18 LAB — LIPASE, BLOOD: Lipase: 27 U/L (ref 11–51)

## 2015-01-18 LAB — URINE MICROSCOPIC-ADD ON

## 2015-01-18 MED ORDER — FENTANYL CITRATE (PF) 100 MCG/2ML IJ SOLN
100.0000 ug | INTRAMUSCULAR | Status: DC | PRN
  Administered 2015-01-18 (×2): 100 ug via INTRAVENOUS
  Filled 2015-01-18 (×2): qty 2

## 2015-01-18 MED ORDER — ONDANSETRON 4 MG PO TBDP
4.0000 mg | ORAL_TABLET | Freq: Once | ORAL | Status: AC
  Administered 2015-01-18: 4 mg via ORAL
  Filled 2015-01-18: qty 1

## 2015-01-18 MED ORDER — ONDANSETRON HCL 4 MG/2ML IJ SOLN
4.0000 mg | Freq: Once | INTRAMUSCULAR | Status: AC
  Administered 2015-01-18: 4 mg via INTRAVENOUS
  Filled 2015-01-18: qty 2

## 2015-01-18 MED ORDER — ONDANSETRON HCL 8 MG PO TABS: 8.0000 mg | ORAL_TABLET | Freq: Three times a day (TID) | ORAL | Status: DC | PRN

## 2015-01-18 MED ORDER — SODIUM CHLORIDE 0.9 % IV BOLUS (SEPSIS)
1000.0000 mL | Freq: Once | INTRAVENOUS | Status: AC
  Administered 2015-01-18: 1000 mL via INTRAVENOUS

## 2015-01-18 NOTE — Discharge Instructions (Signed)
Abdominal Pain, Adult °Many things can cause abdominal pain. Usually, abdominal pain is not caused by a disease and will improve without treatment. It can often be observed and treated at home. Your health care provider will do a physical exam and possibly order blood tests and X-rays to help determine the seriousness of your pain. However, in many cases, more time must pass before a clear cause of the pain can be found. Before that point, your health care provider may not know if you need more testing or further treatment. °HOME CARE INSTRUCTIONS °Monitor your abdominal pain for any changes. The following actions may help to alleviate any discomfort you are experiencing: °· Only take over-the-counter or prescription medicines as directed by your health care provider. °· Do not take laxatives unless directed to do so by your health care provider. °· Try a clear liquid diet (broth, tea, or water) as directed by your health care provider. Slowly move to a bland diet as tolerated. °SEEK MEDICAL CARE IF: °· You have unexplained abdominal pain. °· You have abdominal pain associated with nausea or diarrhea. °· You have pain when you urinate or have a bowel movement. °· You experience abdominal pain that wakes you in the night. °· You have abdominal pain that is worsened or improved by eating food. °· You have abdominal pain that is worsened with eating fatty foods. °· You have a fever. °SEEK IMMEDIATE MEDICAL CARE IF: °· Your pain does not go away within 2 hours. °· You keep throwing up (vomiting). °· Your pain is felt only in portions of the abdomen, such as the right side or the left lower portion of the abdomen. °· You pass bloody or black tarry stools. °MAKE SURE YOU: °· Understand these instructions. °· Will watch your condition. °· Will get help right away if you are not doing well or get worse. °  °This information is not intended to replace advice given to you by your health care provider. Make sure you discuss  any questions you have with your health care provider. °  °Document Released: 10/23/2004 Document Revised: 10/04/2014 Document Reviewed: 09/22/2012 °Elsevier Interactive Patient Education ©2016 [employer] ° °Nausea and Vomiting °Nausea is a sick feeling that often comes before throwing up (vomiting). Vomiting is a reflex where stomach contents come out of your mouth. Vomiting can cause severe loss of body fluids (dehydration). Children and elderly adults can become dehydrated quickly, especially if they also have diarrhea. Nausea and vomiting are symptoms of a condition or disease. It is important to find the cause of your symptoms. °CAUSES  °· Direct irritation of the stomach lining. This irritation can result from increased acid production (gastroesophageal reflux disease), infection, food poisoning, taking certain medicines (such as nonsteroidal anti-inflammatory drugs), alcohol use, or tobacco use. °· Signals from the brain. These signals could be caused by a headache, heat exposure, an inner ear disturbance, increased pressure in the brain from injury, infection, a tumor, or a concussion, pain, emotional stimulus, or metabolic problems. °· An obstruction in the gastrointestinal tract (bowel obstruction). °· Illnesses such as diabetes, hepatitis, gallbladder problems, appendicitis, kidney problems, cancer, sepsis, atypical symptoms of a heart attack, or eating disorders. °· Medical treatments such as chemotherapy and radiation. °· Receiving medicine that makes you sleep (general anesthetic) during surgery. °DIAGNOSIS °Your caregiver may ask for tests to be done if the problems do not improve after a few days. Tests may also be done if symptoms are severe or if the reason for the   nausea and vomiting is not clear. Tests may include: °· Urine tests. °· Blood tests. °· Stool tests. °· Cultures (to look for evidence of infection). °· X-rays or other imaging studies. °Test results can help your caregiver make  decisions about treatment or the need for additional tests. °TREATMENT °You need to stay well hydrated. Drink frequently but in small amounts. You may wish to drink water, sports drinks, clear broth, or eat frozen ice pops or gelatin dessert to help stay hydrated. When you eat, eating slowly may help prevent nausea. There are also some antinausea medicines that may help prevent nausea. °HOME CARE INSTRUCTIONS  °· Take all medicine as directed by your caregiver. °· If you do not have an appetite, do not force yourself to eat. However, you must continue to drink fluids. °· If you have an appetite, eat a normal diet unless your caregiver tells you differently. °¨ Eat a variety of complex carbohydrates (rice, wheat, potatoes, bread), lean meats, yogurt, fruits, and vegetables. °¨ Avoid high-fat foods because they are more difficult to digest. °· Drink enough water and fluids to keep your urine clear or pale yellow. °· If you are dehydrated, ask your caregiver for specific rehydration instructions. Signs of dehydration may include: °¨ Severe thirst. °¨ Dry lips and mouth. °¨ Dizziness. °¨ Dark urine. °¨ Decreasing urine frequency and amount. °¨ Confusion. °¨ Rapid breathing or pulse. °SEEK IMMEDIATE MEDICAL CARE IF:  °· You have blood or brown flecks (like coffee grounds) in your vomit. °· You have black or bloody stools. °· You have a severe headache or stiff neck. °· You are confused. °· You have severe abdominal pain. °· You have chest pain or trouble breathing. °· You do not urinate at least once every 8 hours. °· You develop cold or clammy skin. °· You continue to vomit for longer than 24 to 48 hours. °· You have a fever. °MAKE SURE YOU:  °· Understand these instructions. °· Will watch your condition. °· Will get help right away if you are not doing well or get worse. °  °This information is not intended to replace advice given to you by your health care provider. Make sure you discuss any questions you have with  your health care provider. °  °Document Released: 01/13/2005 Document Revised: 04/07/2011 Document Reviewed: 06/12/2010 °Elsevier Interactive Patient Education ©2016 [employer] ° °

## 2015-01-18 NOTE — ED Notes (Signed)
Pt c/o upper abdominal pain ("stomach and diaphragm") and n/v since "January or February" and diarrhea starting yesterday.  Pt reports having gallbladder removed x "sometime this Summer" w/o symptom relief.  Sts she was seen by GI last week and prescribed an unknown medication, but she did not start taking it until yesterday.

## 2015-01-18 NOTE — ED Notes (Signed)
Patient ambulatory to bathroom with assistance

## 2015-01-18 NOTE — ED Provider Notes (Signed)
CSN: [id number]     Arrival date & time 01/18/15  1557 History   First MD Initiated Contact with Patient 01/18/15 1622     Chief Complaint  Patient presents with  . Nausea  . Emesis  . Abdominal Pain     (Consider location/radiation/quality/duration/timing/severity/associated sxs/prior Treatment) HPI   Kristy Hancock is a 44 y.o. female who presents for evaluation of abdominal pain, which is ongoing for several months. She's been seeing a GI doctor, and he recommended that she takes Sulcrafate. Earlier this year, she saw another GI physician and had an endoscopy. Subsequent to that, she had a cholecystectomy. Her symptoms persist. She has intermittent episodes of vomiting. She has been vomiting today, fluid-containing emesis. He has upper abdominal pain that has been only nature. Denies diarrhea, fever, cough, shortness breath, chest pain, weakness or dizziness. She's taking her usual medications, without relief. There are no other known modifying factors.  Past Medical History  Diagnosis Date  . Anxiety   . Colon polyp 07/24/2006    BENIGN  . Vaginal delivery following previous caesarean section    Past Surgical History  Procedure Laterality Date  . Vaginal birth after cesarean section  2009  . Cesarean section  2002   Family History  Problem Relation Age of Onset  . Hypertension Mother   . Ovarian cancer Paternal Grandmother   . Cancer Paternal Grandmother     breast, OVARIAN  . Heart disease Paternal Grandfather    Social History  Substance Use Topics  . Smoking status: Never Smoker   . Smokeless tobacco: None  . Alcohol Use: No   OB History    No data available     Review of Systems  All other systems reviewed and are negative.     Allergies  Oxycodone  Home Medications   Prior to Admission medications   Medication Sig Start Date End Date Taking? Authorizing Provider  Calcium-Magnesium-Vitamin D (CALCIUM 500 PO) Take 500 mg by mouth daily.   Yes Historical  Provider, MD  cholecalciferol (VITAMIN D) 1000 UNITS tablet Take 1,000 Units by mouth daily.   Yes Historical Provider, MD  Digestive Enzymes (ENZYME DIGEST PO) Take 1 capsule by mouth daily.   Yes Historical Provider, MD  hydrOXYzine (ATARAX/VISTARIL) 25 MG tablet Take 25 mg by mouth at bedtime.   Yes Historical Provider, MD  ibuprofen (ADVIL,MOTRIN) 200 MG tablet Take 400-600 mg by mouth every 6 (six) hours as needed for headache or moderate pain.   Yes Historical Provider, MD  Ketotifen Fumarate (ALLERGY EYE DROPS OP) Apply 1 drop to eye daily as needed (allergies).   Yes Historical Provider, MD  LORYNA 3-0.02 MG tablet Take 1 tablet by mouth daily. 03/28/14  Yes Historical Provider, MD  magnesium oxide (MAG-OX) 400 MG tablet Take 400 mg by mouth daily.   Yes Historical Provider, MD  Omega-3 Fatty Acids (FISH OIL PO) Take 1 capsule by mouth daily.   Yes Historical Provider, MD  omeprazole (PRILOSEC) 40 MG capsule Take 40 mg by mouth daily. 12/04/14  Yes Historical Provider, MD  Probiotic Product (PROBIOTIC PO) Take 1-2 capsules by mouth daily.   Yes Historical Provider, MD  sucralfate (CARAFATE) 1 G tablet Take 1 g by mouth 3 (three) times daily. 01/12/15  Yes Historical Provider, MD  traMADol (ULTRAM) 50 MG tablet Take 1 tablet (50 mg total) by mouth every 6 (six) hours as needed for pain. 03/11/12  Yes Lacretia Leigh, MD  ondansetron (ZOFRAN) 8 MG tablet Take 1  tablet (8 mg total) by mouth every 8 (eight) hours as needed for nausea or vomiting. 01/18/15   Daleen Bo, MD   BP 113/71 mmHg  Pulse 78  Temp(Src) 98.3 F (36.8 C) (Oral)  Resp 16  SpO2 98% Physical Exam  Constitutional: She is oriented to person, place, and time. She appears well-developed and well-nourished.  HENT:  Head: Normocephalic and atraumatic.  Right Ear: External ear normal.  Left Ear: External ear normal.  Eyes: Conjunctivae and EOM are normal. Pupils are equal, round, and reactive to light.  Neck: Normal range of  motion and phonation normal. Neck supple.  Cardiovascular: Normal rate, regular rhythm and normal heart sounds.   Pulmonary/Chest: Effort normal and breath sounds normal. She exhibits no bony tenderness.  Abdominal: Soft. There is tenderness (epigastric, mild).  Musculoskeletal: Normal range of motion.  Neurological: She is alert and oriented to person, place, and time. No cranial nerve deficit or sensory deficit. She exhibits normal muscle tone. Coordination normal.  Skin: Skin is warm, dry and intact.  Psychiatric: She has a normal mood and affect. Her behavior is normal. Judgment and thought content normal.  Nursing note and vitals reviewed.   ED Course  Procedures (including critical care time)  Medications  fentaNYL (SUBLIMAZE) injection 100 mcg (100 mcg Intravenous Given 01/18/15 2138)  ondansetron (ZOFRAN) injection 4 mg (4 mg Intravenous Given 01/18/15 1724)  sodium chloride 0.9 % bolus 1,000 mL (0 mLs Intravenous Stopped 01/18/15 2128)    Patient Vitals for the past 24 hrs:  BP Temp Temp src Pulse Resp SpO2  01/18/15 2118 113/71 mmHg 98.3 F (36.8 C) Oral 78 16 98 %  01/18/15 1853 113/72 mmHg 98 F (36.7 C) Oral 67 15 100 %  01/18/15 1618 142/79 mmHg - - 90 16 100 %    9:40 PM Reevaluation with update and discussion. After initial assessment and treatment, an updated evaluation reveals she is tolerating oral liquids. She complains of persistent upper abdominal pain and nausea. Findings discussed with the patient, all questions answered. Theodore Rahrig L    Labs Review Labs Reviewed  COMPREHENSIVE METABOLIC PANEL - Abnormal; Notable for the following:    Glucose, Bld 108 (*)    All other components within normal limits  CBC - Abnormal; Notable for the following:    WBC 10.8 (*)    All other components within normal limits  URINALYSIS, ROUTINE W REFLEX MICROSCOPIC (NOT AT [facility]) - Abnormal; Notable for the following:    Color, Urine AMBER (*)    APPearance CLOUDY (*)     Hgb urine dipstick SMALL (*)    Ketones, ur 15 (*)    All other components within normal limits  URINE MICROSCOPIC-ADD ON - Abnormal; Notable for the following:    Squamous Epithelial / LPF 6-30 (*)    Bacteria, UA RARE (*)    All other components within normal limits  LIPASE, BLOOD  I-STAT BETA HCG BLOOD, ED ([facility], [facility], [facility] ONLY)    Imaging Review No results found. I have personally reviewed and evaluated these images and lab results as part of my medical decision-making.   EKG Interpretation   Date/Time:  Thursday January 18 2015 17:08:18 EST Ventricular Rate:  65 PR Interval:  121 QRS Duration: 78 QT Interval:  398 QTC Calculation: 414 R Axis:   78 Text Interpretation:  Sinus rhythm Since last tracing rate slower  Confirmed by Eulis Foster  MD, Leeya Rusconi [id number]) on 01/18/2015 5:26:16 PM      MDM  Final diagnoses:  Epigastric pain  Non-intractable vomiting with nausea, vomiting of unspecified type    Subacute abdominal pain with vomiting. Doubt pancreatitis or bacterial infection, metabolic instability or colitis.  Nursing Notes Reviewed/ Care Coordinated Applicable Imaging Reviewed Interpretation of Laboratory Data incorporated into ED treatment  The patient appears reasonably screened and/or stabilized for discharge and I doubt any other medical condition or other [facility] requiring further screening, evaluation, or treatment in the ED at this time prior to discharge.  Plan: Home Medications- Zofran; Home Treatments- rest; return here if the recommended treatment, does not improve the symptoms; Recommended follow up- PCP prn   Daleen Bo, MD 01/18/15 2141

## 2015-01-18 NOTE — ED Notes (Addendum)
Pt c/o abdominal pain. Is violently wretching in triage and vomiting. Says she has had "stomach issues for months now." Recently had cholecystectomy. Unable to obtain clear history from patient. Had to pull over on the side of the road for near-syncopal feelings associated with nausea/vomiting.

## 2015-01-18 NOTE — ED Notes (Signed)
Patient tolerating PO challenge without difficulty.  No nausea/vomiting at present.

## 2015-01-18 NOTE — ED Notes (Signed)
MD at bedside. 

## 2015-01-18 NOTE — ED Notes (Signed)
Nurse drawing labs. 

## 2015-01-18 NOTE — ED Notes (Signed)
Patient given sprite and saltines.

## 2015-01-18 NOTE — Progress Notes (Signed)
Patient listed as having [employer] insurance without a pcp.  EDCM spoke to patient at bedside.  Patient confirms her pcp is Dr. Elenora Gamma with [facility].  System updated.

## 2015-05-20 DIAGNOSIS — J029 Acute pharyngitis, unspecified: Secondary | ICD-10-CM | POA: Diagnosis not present

## 2015-06-08 DIAGNOSIS — H6980 Other specified disorders of Eustachian tube, unspecified ear: Secondary | ICD-10-CM | POA: Diagnosis not present

## 2015-06-12 DIAGNOSIS — R070 Pain in throat: Secondary | ICD-10-CM | POA: Diagnosis not present

## 2015-06-12 DIAGNOSIS — H93293 Other abnormal auditory perceptions, bilateral: Secondary | ICD-10-CM | POA: Diagnosis not present

## 2015-06-12 DIAGNOSIS — H9209 Otalgia, unspecified ear: Secondary | ICD-10-CM | POA: Diagnosis not present

## 2015-06-27 DIAGNOSIS — H9209 Otalgia, unspecified ear: Secondary | ICD-10-CM | POA: Diagnosis not present

## 2015-06-27 DIAGNOSIS — R51 Headache: Secondary | ICD-10-CM | POA: Diagnosis not present

## 2015-06-29 ENCOUNTER — Other Ambulatory Visit (INDEPENDENT_AMBULATORY_CARE_PROVIDER_SITE_OTHER): Payer: Self-pay | Admitting: Otolaryngology

## 2015-06-29 DIAGNOSIS — H9209 Otalgia, unspecified ear: Secondary | ICD-10-CM

## 2015-07-04 ENCOUNTER — Other Ambulatory Visit: Payer: BLUE CROSS/BLUE SHIELD

## 2015-07-10 ENCOUNTER — Ambulatory Visit
Admission: RE | Admit: 2015-07-10 | Discharge: 2015-07-10 | Disposition: A | Discharge status: Home / Self Care | Payer: BLUE CROSS/BLUE SHIELD | Source: Ambulatory Visit | Attending: Otolaryngology | Admitting: Otolaryngology

## 2015-07-10 DIAGNOSIS — H9209 Otalgia, unspecified ear: Secondary | ICD-10-CM

## 2015-07-10 DIAGNOSIS — H9201 Otalgia, right ear: Secondary | ICD-10-CM | POA: Diagnosis not present

## 2015-07-13 DIAGNOSIS — R591 Generalized enlarged lymph nodes: Secondary | ICD-10-CM | POA: Diagnosis not present

## 2015-07-16 ENCOUNTER — Other Ambulatory Visit: Payer: BLUE CROSS/BLUE SHIELD

## 2015-08-08 ENCOUNTER — Ambulatory Visit: Payer: BLUE CROSS/BLUE SHIELD | Admitting: Diagnostic Neuroimaging

## 2015-08-09 ENCOUNTER — Encounter: Payer: Self-pay | Admitting: Neurology

## 2015-08-09 ENCOUNTER — Ambulatory Visit (INDEPENDENT_AMBULATORY_CARE_PROVIDER_SITE_OTHER): Payer: BLUE CROSS/BLUE SHIELD | Admitting: Neurology

## 2015-08-09 VITALS — BP 118/69 | HR 79 | Ht 62.0 in | Wt 119.6 lb

## 2015-08-09 DIAGNOSIS — Z3041 Encounter for surveillance of contraceptive pills: Secondary | ICD-10-CM

## 2015-08-09 DIAGNOSIS — H9201 Otalgia, right ear: Secondary | ICD-10-CM | POA: Diagnosis not present

## 2015-08-09 DIAGNOSIS — M542 Cervicalgia: Secondary | ICD-10-CM | POA: Diagnosis not present

## 2015-08-09 NOTE — Patient Instructions (Signed)
-   will do MRI brain and neck to evaluate any structural lesion - will do blood draw to rule out other conditions - continue gabapentin for now - call us if pain getting worse and we can try tramadol - follow up with PCP - follow up in one month.

## 2015-08-09 NOTE — Progress Notes (Signed)
NEUROLOGY CLINIC NEW PATIENT NOTE  NAME: Kristy Hancock DOB: 1970-03-13 REFERRING PHYSICIAN: Shamleffer, Frederik Schmidt*  I saw Kristy Hancock as a new consult in the neurovascular clinic today regarding  Chief Complaint  Patient presents with  . Referral    Referral from  Dr. Kasandra Knudsen Raynelle Bring Md for persistent ear and neck pain. IT was help by a trial of neurontin. PT was seen by ENT Dr. Albesa Seen ENT MD  .  HPI: Kristy Hancock is a 45 y.o. female with PMH of chronic abdominal pain s/p cholecystectomy who presents as a new patient for persistent ear and neck pain.   She stated that in March this year, she had right side dental fillings at her molar tooth. Since then she started to have right ear pain, inside the ear, aching pain. Went back to dentist, eventually considered dental related and had tooth extracted at end of April. However, ear pain not gone, but also started to feel right neck pain, from behind eye to superior of the shoulder, soreness feeling and tender to touch. Gradually the left neck also had the same feeling. She was referred to ENT, put on tramadol, not effective and put on gabapentin later, feels ear pain much better but still has bilateral neck sore. She was seen by her PCP at [facility] physicians, found to have bilateral "swollen glands" at neck. She was referred back to ENT, had temporal bone CT which was unremarkable. Due to her pain responds to gabapentin, she was told that is due to nerve pain, and she needs to see neurologist.   She denies any HA, speech difficulty, swallowing difficulty, hoarseness, or vision changes. Denies hearing loss, tinnitus, taste change, dizziness, vertigo, numbness or weakness.   She has hx of abdominal pain in 2016 and had endoscopy and colonoscopy and was told all good. Can not find source. Eventually had cholecystectomy and gradually pain went away.   She is on OCP with estrogen and progesterol for oral contraception.   She stated that she has family hx of  autoimmune disease. One of her cousin had many complains eventually found to have celiac disease.   Past Medical History  Diagnosis Date  . Anxiety   . Colon polyp 07/24/2006    BENIGN  . Vaginal delivery following previous caesarean section    Past Surgical History  Procedure Laterality Date  . Vaginal birth after cesarean section  2009  . Cesarean section  2002   Family History  Problem Relation Age of Onset  . Hypertension Mother   . Ovarian cancer Paternal Grandmother   . Cancer Paternal Grandmother     breast, OVARIAN  . Heart disease Paternal Grandfather   . Hearing loss Father    Current Outpatient Prescriptions  Medication Sig Dispense Refill  . Calcium-Magnesium-Vitamin D (CALCIUM 500 PO) Take 500 mg by mouth daily.    . cholecalciferol (VITAMIN D) 1000 UNITS tablet Take 1,000 Units by mouth daily.    . Digestive Enzymes (ENZYME DIGEST PO) Take 1 capsule by mouth daily.    Marland Kitchen gabapentin (NEURONTIN) 300 MG capsule TAKE ONE CAPSULE 3 TIMES A DAY  3  . hydrOXYzine (ATARAX/VISTARIL) 25 MG tablet Take 25 mg by mouth at bedtime.    Marland Kitchen ibuprofen (ADVIL,MOTRIN) 200 MG tablet Take 400-600 mg by mouth every 6 (six) hours as needed for headache or moderate pain.    . Omega-3 Fatty Acids (FISH OIL PO) Take 1 capsule by mouth daily.    . Probiotic  Product (PROBIOTIC PO) Take 1-2 capsules by mouth daily.    . TRI-LO-SPRINTEC 0.18/0.215/0.25 MG-25 MCG tab TAKE 1 TABLET(S) EVERY DAY BY ORAL ROUTE.  0   No current facility-administered medications for this visit.   Allergies  Allergen Reactions  . Oxycodone Itching   Social History   Social History  . Marital Status: Married    Spouse Name: N/A  . Number of Children: N/A  . Years of Education: N/A   Occupational History  . Not on file.   Social History Main Topics  . Smoking status: Never Smoker   . Smokeless tobacco: Not on file  . Alcohol Use: No  . Drug Use: Not on file  . Sexual Activity: Yes    Birth Control/  Protection: Pill   Other Topics Concern  . Not on file   Social History Narrative    Review of Systems Full 14 system review of systems performed and notable only for those listed, all others are neg:  Constitutional:   fatigue Cardiovascular:  Ear/Nose/Throat:  Ringing in ears Skin:  Eyes:  Blurry vision Respiratory:  SOB Gastroitestinal:   Genitourinary:  Hematology/Lymphatic:  Lymph nodes Endocrine: feeling hot, flushing Musculoskeletal:   Allergy/Immunology:   Neurological:  Memory loss, confusion Psychiatric: decreased energy Sleep:    Physical Exam  Filed Vitals:   08/09/15 1312  BP: 118/69  Pulse: 79    General - Well nourished, well developed, in no apparent distress.  Ophthalmologic - Sharp disc margins OU, no papilledema.  Cardiovascular - Regular rate and rhythm with no murmur. Carotid pulses were 2+ without bruits .   Neck - supple, no nuchal rigidity. On neck palpation, pt stated her soreness much improved than before but still feels soreness around bilateral sternomastoid muscles but she has no pain on turning head from side to side. No enlarged lymph nodes palpated.   Mental Status -  Level of arousal and orientation to time, place, and person were intact. Language including expression, naming, repetition, comprehension, reading, and writing was assessed and found intact. Fund of Knowledge was assessed and was intact.  Cranial Nerves II - XII - II - Visual field intact OU. III, IV, VI - Extraocular movements intact. V - Facial sensation intact bilaterally. VII - Facial movement intact bilaterally. VIII - Hearing & vestibular intact bilaterally. X - Palate elevates symmetrically. XI - Chin turning & shoulder shrug intact bilaterally. XII - Tongue protrusion intact.  Motor Strength - The patient's strength was normal in all extremities and pronator drift was absent.  Bulk was normal and fasciculations were absent.   Motor Tone - Muscle tone was  assessed at the neck and appendages and was normal.  Reflexes - The patient's reflexes were normal in all extremities and she had no pathological reflexes.  Sensory - Light touch, temperature/pinprick, vibration and proprioception, and Romberg testing were assessed and were normal.    Coordination - The patient had normal movements in the hands and feet with no ataxia or dysmetria.  Tremor was absent.  Gait and Station - The patient's transfers, posture, gait, station, and turns were observed as normal.   Imaging  I have personally reviewed the radiological images below and agree with the radiology interpretations.  CT temporal bones 06/2015 1. Unremarkable appearance of the right temporal bone. No cause of otalgia identified. 2. 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  Lab Review none    Assessment and  Plan:   In summary, Kristy Hancock is a 45 y.o. female with PMH of chronic abdominal pain s/p cholecystectomy who presents as a new patient for persistent ear and neck pain. Has been followed with dentist, ENT and PCP but not sure about the diagnosis. On tramadol but feels gabapentin most helpful. Neuro exam benign. Does not seems neuro related. However, she is on OCP with ear and neck pain, needs to rule out venous thrombosis, although sharp fundi exam. Will do MRV. To further elucidate etiology will do MRI head and neck soft tissue such as lymphoma due to her "swollen glands" once found by PCP. Continue gabapentin for now.  - will do MRI brain and neck soft tissue as well as MRV to evaluate any structural lesion - will do blood draw to rule out other conditions - continue gabapentin for now - call us if pain getting worse and we can try tramadol - follow up with PCP - follow up in one month.  Thank you very much for the opportunity to participate in the care of this patient.  Please do not hesitate  to call if any questions or concerns arise.  Orders Placed This Encounter  Procedures  . MR Neck Soft Tissue Only W Wo Contrast    Standing Status: Future     Number of Occurrences:      Standing Expiration Date: 10/09/2016    Order Specific Question:  Reason for Exam (SYMPTOM  OR DIAGNOSIS REQUIRED)    Answer:  neck pain bilaterally    Order Specific Question:  Preferred imaging location?    Answer:  Internal    Order Specific Question:  What is the patient's sedation requirement?    Answer:  No Sedation    Order Specific Question:  Does the patient have a pacemaker or implanted devices?    Answer:  No  . MR Brain W Wo Contrast    Standing Status: Future     Number of Occurrences:      Standing Expiration Date: 10/09/2016    Order Specific Question:  If indicated for the ordered procedure, I authorize the administration of contrast media per Radiology protocol    Answer:  Yes    Order Specific Question:  Reason for Exam (SYMPTOM  OR DIAGNOSIS REQUIRED)    Answer:  ear pain and neck pain    Order Specific Question:  Preferred imaging location?    Answer:  Internal    Order Specific Question:  What is the patient's sedation requirement?    Answer:  No Sedation    Order Specific Question:  Does the patient have a pacemaker or implanted devices?    Answer:  No  . MR MRV HEAD WO CM    Standing Status: Future     Number of Occurrences:      Standing Expiration Date: 10/09/2016    Order Specific Question:  Reason for Exam (SYMPTOM  OR DIAGNOSIS REQUIRED)    Answer:  neck pain and on birth control pills    Order Specific Question:  Preferred imaging location?    Answer:  Internal    Order Specific Question:  What is the patient's sedation requirement?    Answer:  No Sedation    Order Specific Question:  Does the patient have a pacemaker or implanted devices?    Answer:  No  . CBC (no diff)  . CMP  . Sedimentation rate  . C-reactive protein  . ANA  . Anti-DNA antibody,  double-stranded  Meds ordered this encounter  Medications  . gabapentin (NEURONTIN) 300 MG capsule    Sig: TAKE ONE CAPSULE 3 TIMES A DAY    Refill:  3  . TRI-LO-SPRINTEC 0.18/0.215/0.25 MG-25 MCG tab    Sig: TAKE 1 TABLET(S) EVERY DAY BY ORAL ROUTE.    Refill:  0    Patient Instructions  - will do MRI brain and neck to evaluate any structural lesion - will do blood draw to rule out other conditions - continue gabapentin for now - call us if pain getting worse and we can try tramadol - follow up with PCP - follow up in one month.    Rosalin Hawking, MD PhD [facility] [address] [place], [place] [address] [phone]

## 2015-08-10 DIAGNOSIS — Z3041 Encounter for surveillance of contraceptive pills: Secondary | ICD-10-CM | POA: Insufficient documentation

## 2015-08-10 LAB — COMPREHENSIVE METABOLIC PANEL
A/G RATIO: 1.5 (ref 1.2–2.2)
ALBUMIN: 4.1 g/dL (ref 3.5–5.5)
ALK PHOS: 56 IU/L (ref 39–117)
ALT: 16 IU/L (ref 0–32)
AST: 23 IU/L (ref 0–40)
BUN/Creatinine Ratio: 23 (ref 9–23)
BUN: 15 mg/dL (ref 6–24)
Bilirubin Total: 0.2 mg/dL (ref 0.0–1.2)
CO2: 26 mmol/L (ref 18–29)
CREATININE: 0.66 mg/dL (ref 0.57–1.00)
Calcium: 9.8 mg/dL (ref 8.7–10.2)
Chloride: 99 mmol/L (ref 96–106)
GFR calc Af Amer: 123 mL/min/{1.73_m2} (ref >59)
GFR, EST NON AFRICAN AMERICAN: 107 mL/min/{1.73_m2} (ref >59)
GLOBULIN, TOTAL: 2.8 g/dL (ref 1.5–4.5)
Glucose: 84 mg/dL (ref 65–99)
Potassium: 4.6 mmol/L (ref 3.5–5.2)
Sodium: 137 mmol/L (ref 134–144)
Total Protein: 6.9 g/dL (ref 6.0–8.5)

## 2015-08-10 LAB — C-REACTIVE PROTEIN: CRP: 6.7 mg/L — AB (ref 0.0–4.9)

## 2015-08-10 LAB — CBC
HEMATOCRIT: 40 % (ref 34.0–46.6)
HEMOGLOBIN: 13.1 g/dL (ref 11.1–15.9)
MCH: 29.1 pg (ref 26.6–33.0)
MCHC: 32.8 g/dL (ref 31.5–35.7)
MCV: 89 fL (ref 79–97)
Platelets: 346 10*3/uL (ref 150–379)
RBC: 4.5 x10E6/uL (ref 3.77–5.28)
RDW: 13.6 % (ref 12.3–15.4)
WBC: 9.2 10*3/uL (ref 3.4–10.8)

## 2015-08-10 LAB — SEDIMENTATION RATE: SED RATE: 2 mm/h (ref 0–32)

## 2015-08-10 LAB — ANTI-DNA ANTIBODY, DOUBLE-STRANDED

## 2015-08-10 LAB — ANA: ANA: NEGATIVE

## 2015-08-14 ENCOUNTER — Telehealth: Payer: Self-pay | Admitting: *Deleted

## 2015-08-14 NOTE — Telephone Encounter (Signed)
Per Dr Erlinda Hong, spoke with patient and informed her that her labs were unremarkable, no clear evidence off autoimmune disease based on the tests results. Dr Erlinda Hong will continue with her current treatment plan. She verbalized understanding, then she asked about her MRI, stated she had not heard about scheduling it. Advised will route this to Danielle to check on the status. She verbalized appreciation.

## 2015-08-29 NOTE — Telephone Encounter (Signed)
Patient has been sent to [facility] for scheduling and they have been trying to reach her. I called the patient and spoke with her. She stated that she did receive a message from [facility] but she has not called them back because she think the pain may be related to some teeth problems she's been having. She stated that she would talk to her dentist and call us back to let us know what she is going to do.

## 2015-08-30 DIAGNOSIS — Z6821 Body mass index (BMI) 21.0-21.9, adult: Secondary | ICD-10-CM | POA: Diagnosis not present

## 2015-08-30 DIAGNOSIS — Z01419 Encounter for gynecological examination (general) (routine) without abnormal findings: Secondary | ICD-10-CM | POA: Diagnosis not present

## 2015-08-30 DIAGNOSIS — N760 Acute vaginitis: Secondary | ICD-10-CM | POA: Diagnosis not present

## 2015-08-30 DIAGNOSIS — Z1231 Encounter for screening mammogram for malignant neoplasm of breast: Secondary | ICD-10-CM | POA: Diagnosis not present

## 2015-09-18 ENCOUNTER — Ambulatory Visit: Payer: BLUE CROSS/BLUE SHIELD | Admitting: Neurology

## 2015-10-23 ENCOUNTER — Telehealth: Payer: Self-pay | Admitting: Neurology

## 2015-10-23 NOTE — Telephone Encounter (Signed)
Discussed with pt over the phone. She still complains of right eye pain, inside the ear and getting worse. She also felt some pain on the left ear. She also has right jaw pain, and has jaw clicking. She said she may grunting teeth at night so that she felt right jaw tightness on waking up. She has seen ENT twice and was told her ears are good. She also followed with dentist and fixed the cavity on the right but does not help for the pain. She mentioned to her dentist about TMJ disorders but was told not TMJ problem. She is searching for a TMJ specialist but her insurance was not accepted.   Her symptoms seem like TMJ related. And her ESR and CRP were low and not consistent with [facility]. She complains of frontal and back of skull headache, pressure feeling, especially in the afternoon after all the activities. I told her that this is more consistent with tension headache due to stress and anxiety. I encourage her to take tylenol or ibuprofen for relief.   She also mentioned that for the MRIs she need to pay $3000 deductible first and she has no money for that. She would like to postpone the MRIs first and seek for TMJ specialist first. I am OK with that. She expressed understanding and appreciation.   Rosalin Hawking, MD PhD Stroke Neurology 10/23/2015 4:59 PM

## 2015-10-23 NOTE — Telephone Encounter (Signed)
Patient says she is scheduled for an MRI on 11-02-15 @ [facility] but she says her headaches have been really bad for the last couple of weeks. She wants to know if there is a medication she can take until the MRI. The patient uses [facility] in [place]. Please call and advise.

## 2015-10-23 NOTE — Telephone Encounter (Signed)
Message sent to Dr. Erlinda Hong. Pt had appt in 08/2015 but cancel the appt. Pt has no  appts schedule at [facility]. Pt does have MRI schedule in 10/2015.

## 2015-10-25 ENCOUNTER — Emergency Department (HOSPITAL_COMMUNITY)
Admission: EM | Admit: 2015-10-25 | Discharge: 2015-10-25 | Disposition: A | Discharge status: Home / Self Care | Payer: BLUE CROSS/BLUE SHIELD | Source: Home / Self Care

## 2015-10-25 ENCOUNTER — Encounter (HOSPITAL_COMMUNITY): Payer: Self-pay | Admitting: Emergency Medicine

## 2015-10-25 DIAGNOSIS — Z5321 Procedure and treatment not carried out due to patient leaving prior to being seen by health care provider: Secondary | ICD-10-CM | POA: Insufficient documentation

## 2015-10-25 DIAGNOSIS — G44209 Tension-type headache, unspecified, not intractable: Secondary | ICD-10-CM | POA: Diagnosis not present

## 2015-10-25 DIAGNOSIS — R51 Headache: Secondary | ICD-10-CM | POA: Diagnosis present

## 2015-10-25 DIAGNOSIS — H9203 Otalgia, bilateral: Secondary | ICD-10-CM | POA: Insufficient documentation

## 2015-10-25 DIAGNOSIS — R42 Dizziness and giddiness: Secondary | ICD-10-CM | POA: Diagnosis not present

## 2015-10-25 DIAGNOSIS — G43909 Migraine, unspecified, not intractable, without status migrainosus: Secondary | ICD-10-CM | POA: Insufficient documentation

## 2015-10-25 DIAGNOSIS — H9209 Otalgia, unspecified ear: Secondary | ICD-10-CM

## 2015-10-25 LAB — COMPREHENSIVE METABOLIC PANEL
ALT: 30 U/L (ref 14–54)
AST: 31 U/L (ref 15–41)
Albumin: 3.9 g/dL (ref 3.5–5.0)
Alkaline Phosphatase: 53 U/L (ref 38–126)
Anion gap: 9 (ref 5–15)
BUN: 10 mg/dL (ref 6–20)
CO2: 24 mmol/L (ref 22–32)
Calcium: 9.1 mg/dL (ref 8.9–10.3)
Chloride: 103 mmol/L (ref 101–111)
Creatinine, Ser: 0.86 mg/dL (ref 0.44–1.00)
GFR calc Af Amer: >60 mL/min (ref >60)
GFR calc non Af Amer: >60 mL/min (ref >60)
Glucose, Bld: 90 mg/dL (ref 65–99)
Potassium: 3.7 mmol/L (ref 3.5–5.1)
Sodium: 136 mmol/L (ref 135–145)
Total Bilirubin: 0.7 mg/dL (ref 0.3–1.2)
Total Protein: 7 g/dL (ref 6.5–8.1)

## 2015-10-25 LAB — CBC WITH DIFFERENTIAL/PLATELET
Basophils Absolute: 0 10*3/uL (ref 0.0–0.1)
Basophils Relative: 0 %
Eosinophils Absolute: 0.1 10*3/uL (ref 0.0–0.7)
Eosinophils Relative: 1 %
HCT: 43.6 % (ref 36.0–46.0)
Hemoglobin: 14.2 g/dL (ref 12.0–15.0)
Lymphocytes Relative: 33 %
Lymphs Abs: 3.1 10*3/uL (ref 0.7–4.0)
MCH: 29.5 pg (ref 26.0–34.0)
MCHC: 32.6 g/dL (ref 30.0–36.0)
MCV: 90.5 fL (ref 78.0–100.0)
Monocytes Absolute: 0.4 10*3/uL (ref 0.1–1.0)
Monocytes Relative: 5 %
Neutro Abs: 5.7 10*3/uL (ref 1.7–7.7)
Neutrophils Relative %: 61 %
Platelets: 273 10*3/uL (ref 150–400)
RBC: 4.82 MIL/uL (ref 3.87–5.11)
RDW: 12.1 % (ref 11.5–15.5)
WBC: 9.3 10*3/uL (ref 4.0–10.5)

## 2015-10-25 LAB — URINALYSIS, ROUTINE W REFLEX MICROSCOPIC
BILIRUBIN URINE: NEGATIVE
Glucose, UA: NEGATIVE mg/dL
KETONES UR: 40 mg/dL — AB
NITRITE: NEGATIVE
PH: 5.5 (ref 5.0–8.0)
PROTEIN: NEGATIVE mg/dL
Specific Gravity, Urine: 1.025 (ref 1.005–1.030)

## 2015-10-25 LAB — URINE MICROSCOPIC-ADD ON: WBC, UA: NONE SEEN WBC/hpf (ref 0–5)

## 2015-10-25 LAB — POC URINE PREG, ED: Preg Test, Ur: NEGATIVE

## 2015-10-25 NOTE — ED Triage Notes (Signed)
Pt. reports frontal headache for 2 weeks with right ear ache , denies head injury , no nausea or photophobia . No fever or chills.

## 2015-10-25 NOTE — ED Triage Notes (Signed)
Pt arrives via POV from home with frequent tension headaches over the last two weeks. PT reports recent head CT that was neg. Pt scheduled for MRI on the 6th. States pain was too bad and came here. Also c/o bilateral ear pain for the last 5 months. Pt taking gabapentin for the ear pain as prescribed by ENT. Pt alert, oriented x4, stroke scale neg.

## 2015-10-26 ENCOUNTER — Encounter (HOSPITAL_COMMUNITY): Payer: Self-pay | Admitting: *Deleted

## 2015-10-26 ENCOUNTER — Emergency Department (HOSPITAL_COMMUNITY)
Admission: EM | Admit: 2015-10-26 | Discharge: 2015-10-26 | Disposition: A | Discharge status: Home / Self Care | Payer: BLUE CROSS/BLUE SHIELD | Attending: Emergency Medicine | Admitting: Emergency Medicine

## 2015-10-26 ENCOUNTER — Emergency Department (HOSPITAL_COMMUNITY): Payer: BLUE CROSS/BLUE SHIELD

## 2015-10-26 DIAGNOSIS — R519 Headache, unspecified: Secondary | ICD-10-CM

## 2015-10-26 DIAGNOSIS — H9203 Otalgia, bilateral: Secondary | ICD-10-CM

## 2015-10-26 DIAGNOSIS — G44209 Tension-type headache, unspecified, not intractable: Secondary | ICD-10-CM

## 2015-10-26 DIAGNOSIS — R51 Headache: Secondary | ICD-10-CM

## 2015-10-26 DIAGNOSIS — R42 Dizziness and giddiness: Secondary | ICD-10-CM | POA: Diagnosis not present

## 2015-10-26 MED ORDER — GADOBENATE DIMEGLUMINE 529 MG/ML IV SOLN
10.0000 mL | Freq: Once | INTRAVENOUS | Status: AC | PRN
  Administered 2015-10-26: 10 mL via INTRAVENOUS

## 2015-10-26 MED ORDER — DEXAMETHASONE SODIUM PHOSPHATE 10 MG/ML IJ SOLN
10.0000 mg | Freq: Once | INTRAMUSCULAR | Status: AC
  Administered 2015-10-26: 10 mg via INTRAVENOUS
  Filled 2015-10-26: qty 1

## 2015-10-26 MED ORDER — SODIUM CHLORIDE 0.9 % IV BOLUS (SEPSIS)
1000.0000 mL | Freq: Once | INTRAVENOUS | Status: AC
  Administered 2015-10-26: 1000 mL via INTRAVENOUS

## 2015-10-26 MED ORDER — DIPHENHYDRAMINE HCL 50 MG/ML IJ SOLN
25.0000 mg | Freq: Once | INTRAMUSCULAR | Status: AC
  Administered 2015-10-26: 25 mg via INTRAVENOUS
  Filled 2015-10-26: qty 1

## 2015-10-26 MED ORDER — METOCLOPRAMIDE HCL 5 MG/ML IJ SOLN
10.0000 mg | Freq: Once | INTRAMUSCULAR | Status: AC
  Administered 2015-10-26: 10 mg via INTRAVENOUS
  Filled 2015-10-26: qty 2

## 2015-10-26 NOTE — ED Provider Notes (Addendum)
[facility] DEPT Provider Note   CSN: [id number] Arrival date & time: 10/25/15  2151   By signing my name below, I, Maud Deed. Royston Sinner, attest that this documentation has been prepared under the direction and in the presence of Blanchie Dessert, MD.  Electronically Signed: Maud Deed. Royston Sinner, ED Scribe. 10/26/15. 2:00 AM.   History   Chief Complaint Chief Complaint  Patient presents with  . Headache  . Otalgia   The history is provided by the patient. No language interpreter was used.    HPI Comments: Kristy Hancock is a 45 y.o. female without any pertinent past medical history who presents to the Emergency Department complaining of constant, worsening frontal HA with associated "soreness" with light touch to perimeter of head and face x 2 weeks. Pain is described as pressure. Intermittent dizziness reported when HAs are severe. She also reports bilateral otalgia; R greater than L x 2 weeks. Pt was recently evaluated by ENT in April of this year with CT scan performed which did not reveal any abnormal findings. She was then encouraged to follow with neurology for further evaluation. Pt states she was recently started on Gabapentin 5 months ago. However, she states her vision has become more blurry since starting medication. No recent fever, chills, nausea, or vomiting reported.  PCP: Ileana Roup, MD    Past Medical History:  Diagnosis Date  . Anxiety   . Colon polyp 07/24/2006   BENIGN  . Vaginal delivery following previous caesarean section     Patient Active Problem List   Diagnosis Date Noted  . Uses oral contraception 08/10/2015  . Neck pain, bilateral 08/09/2015  . Right ear pain 08/09/2015    Past Surgical History:  Procedure Laterality Date  . CESAREAN SECTION  2002  . CHOLECYSTECTOMY    . VAGINAL BIRTH AFTER CESAREAN SECTION  2009    OB History    No data available       Home Medications    Prior to Admission medications   Medication Sig Start Date End  Date Taking? Authorizing Provider  Calcium-Magnesium-Vitamin D (CALCIUM 500 PO) Take 500 mg by mouth daily.    Historical Provider, MD  cholecalciferol (VITAMIN D) 1000 UNITS tablet Take 1,000 Units by mouth daily.    Historical Provider, MD  Digestive Enzymes (ENZYME DIGEST PO) Take 1 capsule by mouth daily.    Historical Provider, MD  gabapentin (NEURONTIN) 300 MG capsule TAKE ONE CAPSULE 3 TIMES A DAY 07/23/15   Historical Provider, MD  hydrOXYzine (ATARAX/VISTARIL) 25 MG tablet Take 25 mg by mouth at bedtime.    Historical Provider, MD  ibuprofen (ADVIL,MOTRIN) 200 MG tablet Take 400-600 mg by mouth every 6 (six) hours as needed for headache or moderate pain.    Historical Provider, MD  Omega-3 Fatty Acids (FISH OIL PO) Take 1 capsule by mouth daily.    Historical Provider, MD  Probiotic Product (PROBIOTIC PO) Take 1-2 capsules by mouth daily.    Historical Provider, MD  TRI-LO-SPRINTEC 0.18/0.215/0.25 MG-25 MCG tab TAKE 1 TABLET(S) EVERY DAY BY ORAL ROUTE. 07/24/15   Historical Provider, MD    Family History Family History  Problem Relation Age of Onset  . Hypertension Mother   . Hearing loss Father   . Ovarian cancer Paternal Grandmother   . Cancer Paternal Grandmother     breast, OVARIAN  . Heart disease Paternal Grandfather     Social History Social History  Substance Use Topics  . Smoking status: Never Smoker  .  Smokeless tobacco: Not on file  . Alcohol use No     Allergies   Oxycodone   Review of Systems Review of Systems  Constitutional: Negative for chills and fever.  HENT: Positive for ear pain.   Eyes: Positive for photophobia and visual disturbance.  Gastrointestinal: Negative for nausea and vomiting.  Neurological: Positive for headaches.  All other systems reviewed and are negative.    Physical Exam Updated Vital Signs BP 102/76 (BP Location: Right Arm)   Pulse 72   Temp 98.3 F (36.8 C) (Oral)   Resp 18   LMP 10/11/2015   SpO2 99%   Physical  Exam  Constitutional: She is oriented to person, place, and time. She appears well-developed and well-nourished. No distress.  HENT:  Head: Normocephalic and atraumatic.  Mouth/Throat: Oropharynx is clear and moist.  Tenderness over the frontal and maxillary sinuses bilaterally  Eyes: Conjunctivae and EOM are normal. Pupils are equal, round, and reactive to light. Right eye exhibits no discharge. Left eye exhibits no discharge.  Photophobia and no papilledema  Neck: Normal range of motion. Neck supple. No spinous process tenderness present. No neck rigidity. No Brudzinski's sign and no Kernig's sign noted.  Cardiovascular: Normal rate, regular rhythm, normal heart sounds and intact distal pulses.   No murmur heard. Pulmonary/Chest: Effort normal and breath sounds normal. No respiratory distress. She has no wheezes. She has no rales.  Abdominal: Soft. She exhibits no distension. There is no tenderness.  Musculoskeletal: Normal range of motion. She exhibits no edema or tenderness.  Lymphadenopathy:    She has no cervical adenopathy.  Neurological: She is alert and oriented to person, place, and time. She has normal strength. No cranial nerve deficit or sensory deficit. Coordination and gait normal. GCS eye subscore is 4. GCS verbal subscore is 5. GCS motor subscore is 6.  Skin: Skin is warm and dry.  Psychiatric: She has a normal mood and affect. Her behavior is normal. Judgment normal.  Nursing note and vitals reviewed.    ED Treatments / Results   DIAGNOSTIC STUDIES: Oxygen Saturation is 99% on RA, Normal by my interpretation.    COORDINATION OF CARE: 1:58 AM- Will order blood work and urinalysis. Discussed treatment plan with pt at bedside and pt agreed to plan.     Labs (all labs ordered are listed, but only abnormal results are displayed) Labs Reviewed  URINALYSIS, ROUTINE W REFLEX MICROSCOPIC (NOT AT [facility]) - Abnormal; Notable for the following:       Result Value   APPearance  CLOUDY (*)    Hgb urine dipstick MODERATE (*)    Ketones, ur 40 (*)    Leukocytes, UA TRACE (*)    All other components within normal limits  URINE MICROSCOPIC-ADD ON - Abnormal; Notable for the following:    Squamous Epithelial / LPF 6-30 (*)    Bacteria, UA RARE (*)    All other components within normal limits  CBC WITH DIFFERENTIAL/PLATELET  COMPREHENSIVE METABOLIC PANEL  POC URINE PREG, ED    EKG  EKG Interpretation None       Radiology Mr Jeri Cos Or Wo Contrast  Result Date: 10/26/2015 CLINICAL DATA:  Constant severe frontal headache, facial tenderness for 2 weeks. Intermittent dizziness. Began gabapentin 5 months ago. EXAM: MRI HEAD WITH AND WITHOUT CONTRAST MRV HEAD WITHOUT CONTRAST MR NECK WITH AND WITHOUT CONTRAST TECHNIQUE: Multiplanar, multisequence MR imaging of the neck was performed. No intravenous contrast was administered. Multiplanar, multiecho pulse sequences of the brain  and surrounding structures were obtained with and without intravenous contrast. Angiographic images of the intracranial venous structures were obtained using MRV technique without intravenous contrast. Multiplanar, multi echo pulse sequences in the neck and surrounding structures were obtained with and without contrast. COMPARISON:  Temporal bone CT July 10, 2015 FINDINGS: MR HEAD FINDINGS INTRACRANIAL CONTENTS: No reduced diffusion to suggest acute ischemia nor hyperacute demyelination. No susceptibility artifact to suggest hemorrhage. The ventricles and sulci are normal for patient's age. No suspicious parenchymal signal, mass lesions, mass effect. No abnormal intraparenchymal or extra-axial enhancement. No abnormal extra-axial fluid collections. No extra-axial masses. Normal major intracranial vascular flow voids present at skull base. ORBITS: The included ocular globes and orbital contents are non-suspicious. SINUSES: The mastoid air-cells and included paranasal sinuses are well-aerated. SKULL/SOFT  TISSUES: No abnormal sellar expansion. No suspicious calvarial bone marrow signal. Craniocervical junction maintained. OTHER: None. MRV HEAD FINDINGS Normal flow related enhancement within the superior sagittal sinus, torcula of the Herophili, bilateral transverse, sigmoid sinuses and included internal jugular veins. Normal flow related enhancement of the internal cerebral veins. MR NECK FINDINGS- moderately motion degraded examination PHARYNX AND LARYNX: Normal. SALIVARY GLANDS: Normal. THYROID: Normal. LYMPH NODES: No lymphadenopathy by CT size criteria. VASCULAR: Normal vessel flow voids. SKELETON: No advanced degenerative changes cervical spine. No abnormal osseous or intradiscal enhancement. No other abnormal cord, leptomeningeal or epidural enhancement. UPPER CHEST: Lung apices are clear. OTHER: None. IMPRESSION: Negative MRI head with and without contrast. Negative MRV head. Negative moderately motion degraded MRA neck with and without contrast. Electronically Signed   By: Elon Alas M.D.   On: 10/26/2015 04:45   Mr Neck Soft Tissue Only W Or Wo Contrast  Result Date: 10/26/2015 CLINICAL DATA:  Constant severe frontal headache, facial tenderness for 2 weeks. Intermittent dizziness. Began gabapentin 5 months ago. EXAM: MRI HEAD WITH AND WITHOUT CONTRAST MRV HEAD WITHOUT CONTRAST MR NECK WITH AND WITHOUT CONTRAST TECHNIQUE: Multiplanar, multisequence MR imaging of the neck was performed. No intravenous contrast was administered. Multiplanar, multiecho pulse sequences of the brain and surrounding structures were obtained with and without intravenous contrast. Angiographic images of the intracranial venous structures were obtained using MRV technique without intravenous contrast. Multiplanar, multi echo pulse sequences in the neck and surrounding structures were obtained with and without contrast. COMPARISON:  Temporal bone CT July 10, 2015 FINDINGS: MR HEAD FINDINGS INTRACRANIAL CONTENTS: No reduced  diffusion to suggest acute ischemia nor hyperacute demyelination. No susceptibility artifact to suggest hemorrhage. The ventricles and sulci are normal for patient's age. No suspicious parenchymal signal, mass lesions, mass effect. No abnormal intraparenchymal or extra-axial enhancement. No abnormal extra-axial fluid collections. No extra-axial masses. Normal major intracranial vascular flow voids present at skull base. ORBITS: The included ocular globes and orbital contents are non-suspicious. SINUSES: The mastoid air-cells and included paranasal sinuses are well-aerated. SKULL/SOFT TISSUES: No abnormal sellar expansion. No suspicious calvarial bone marrow signal. Craniocervical junction maintained. OTHER: None. MRV HEAD FINDINGS Normal flow related enhancement within the superior sagittal sinus, torcula of the Herophili, bilateral transverse, sigmoid sinuses and included internal jugular veins. Normal flow related enhancement of the internal cerebral veins. MR NECK FINDINGS- moderately motion degraded examination PHARYNX AND LARYNX: Normal. SALIVARY GLANDS: Normal. THYROID: Normal. LYMPH NODES: No lymphadenopathy by CT size criteria. VASCULAR: Normal vessel flow voids. SKELETON: No advanced degenerative changes cervical spine. No abnormal osseous or intradiscal enhancement. No other abnormal cord, leptomeningeal or epidural enhancement. UPPER CHEST: Lung apices are clear. OTHER: None. IMPRESSION: Negative MRI head with and  without contrast. Negative MRV head. Negative moderately motion degraded MRA neck with and without contrast. Electronically Signed   By: Elon Alas M.D.   On: 10/26/2015 04:45   Mr Mrv Head Wo Cm  Result Date: 10/26/2015 CLINICAL DATA:  Constant severe frontal headache, facial tenderness for 2 weeks. Intermittent dizziness. Began gabapentin 5 months ago. EXAM: MRI HEAD WITH AND WITHOUT CONTRAST MRV HEAD WITHOUT CONTRAST MR NECK WITH AND WITHOUT CONTRAST TECHNIQUE: Multiplanar,  multisequence MR imaging of the neck was performed. No intravenous contrast was administered. Multiplanar, multiecho pulse sequences of the brain and surrounding structures were obtained with and without intravenous contrast. Angiographic images of the intracranial venous structures were obtained using MRV technique without intravenous contrast. Multiplanar, multi echo pulse sequences in the neck and surrounding structures were obtained with and without contrast. COMPARISON:  Temporal bone CT July 10, 2015 FINDINGS: MR HEAD FINDINGS INTRACRANIAL CONTENTS: No reduced diffusion to suggest acute ischemia nor hyperacute demyelination. No susceptibility artifact to suggest hemorrhage. The ventricles and sulci are normal for patient's age. No suspicious parenchymal signal, mass lesions, mass effect. No abnormal intraparenchymal or extra-axial enhancement. No abnormal extra-axial fluid collections. No extra-axial masses. Normal major intracranial vascular flow voids present at skull base. ORBITS: The included ocular globes and orbital contents are non-suspicious. SINUSES: The mastoid air-cells and included paranasal sinuses are well-aerated. SKULL/SOFT TISSUES: No abnormal sellar expansion. No suspicious calvarial bone marrow signal. Craniocervical junction maintained. OTHER: None. MRV HEAD FINDINGS Normal flow related enhancement within the superior sagittal sinus, torcula of the Herophili, bilateral transverse, sigmoid sinuses and included internal jugular veins. Normal flow related enhancement of the internal cerebral veins. MR NECK FINDINGS- moderately motion degraded examination PHARYNX AND LARYNX: Normal. SALIVARY GLANDS: Normal. THYROID: Normal. LYMPH NODES: No lymphadenopathy by CT size criteria. VASCULAR: Normal vessel flow voids. SKELETON: No advanced degenerative changes cervical spine. No abnormal osseous or intradiscal enhancement. No other abnormal cord, leptomeningeal or epidural enhancement. UPPER CHEST:  Lung apices are clear. OTHER: None. IMPRESSION: Negative MRI head with and without contrast. Negative MRV head. Negative moderately motion degraded MRA neck with and without contrast. Electronically Signed   By: Elon Alas M.D.   On: 10/26/2015 04:45    Procedures Procedures (including critical care time)  Medications Ordered in ED Medications - No data to display   Initial Impression / Assessment and Plan / ED Course  I have reviewed the triage vital signs and the nursing notes.  Pertinent labs & imaging results that were available during my care of the patient were reviewed by me and considered in my medical decision making (see chart for details).  Clinical Course   Patient is a 45 year old female presenting today with worsening ear pain and headaches over the last 2 weeks. She has had ear pain for the last 6 months has seen ENT and neurology and thought to be potential TMJ however in the last 2 weeks she started to develop severe headaches which are new. She denies any neurologic symptoms with them but states since April after starting gabapentin she's noticed intermittent blurry vision and states her vision is more blurry in the last 2 weeks. She saw neurology who recommended MRI of the brain and neck and MRV. Patient does have a scheduled for the sixth but given her worsening symptoms she is scared to wait that long. On exam patient has no signs of papilledema and neurologic exam is normal. She does have tenderness is the sinuses but she denies any congestion, fever. Pt  given headache cocktail and MRIs ordered  5:01 AM MRI neg for pathology.  Pt improved after headache cocktail.  Will have f/u with neuro.  Final Clinical Impressions(s) / ED Diagnoses   Final diagnoses:  Headache  Otalgia, bilateral  Tension headache    New Prescriptions New Prescriptions   No medications on file   I personally performed the services described in this documentation, which was scribed in  my presence.  The recorded information has been reviewed and considered.    Blanchie Dessert, MD 10/26/15 [id number]    Blanchie Dessert, MD 10/26/15 [id number]    Blanchie Dessert, MD 10/26/15 [phone]

## 2015-10-26 NOTE — ED Notes (Signed)
EDP at bedside  

## 2015-10-30 DIAGNOSIS — M26623 Arthralgia of bilateral temporomandibular joint: Secondary | ICD-10-CM | POA: Diagnosis not present

## 2015-10-30 DIAGNOSIS — M26633 Articular disc disorder of bilateral temporomandibular joint: Secondary | ICD-10-CM | POA: Diagnosis not present

## 2015-11-01 ENCOUNTER — Other Ambulatory Visit: Payer: BLUE CROSS/BLUE SHIELD

## 2015-11-02 ENCOUNTER — Inpatient Hospital Stay: Admission: RE | Admit: 2015-11-02 | Payer: BLUE CROSS/BLUE SHIELD | Source: Ambulatory Visit

## 2015-11-02 ENCOUNTER — Other Ambulatory Visit: Payer: BLUE CROSS/BLUE SHIELD

## 2015-11-21 DIAGNOSIS — R51 Headache: Secondary | ICD-10-CM | POA: Diagnosis not present

## 2015-11-21 DIAGNOSIS — M791 Myalgia: Secondary | ICD-10-CM | POA: Diagnosis not present

## 2016-02-06 DIAGNOSIS — Z23 Encounter for immunization: Secondary | ICD-10-CM | POA: Diagnosis not present

## 2016-02-06 DIAGNOSIS — Z0000 Encounter for general adult medical examination without abnormal findings: Secondary | ICD-10-CM | POA: Diagnosis not present

## 2016-05-26 IMAGING — CR DG CHEST 2V
2 series · 2 of 2 positions shown · non-contrast
Comparison: Radiograph 03/11/2012

CLINICAL DATA: Sternal chest pain and upper abdominal pain for 2-3
weeks. Pain increases after eating.

EXAM:
CHEST  2 VIEW

[chest pa]
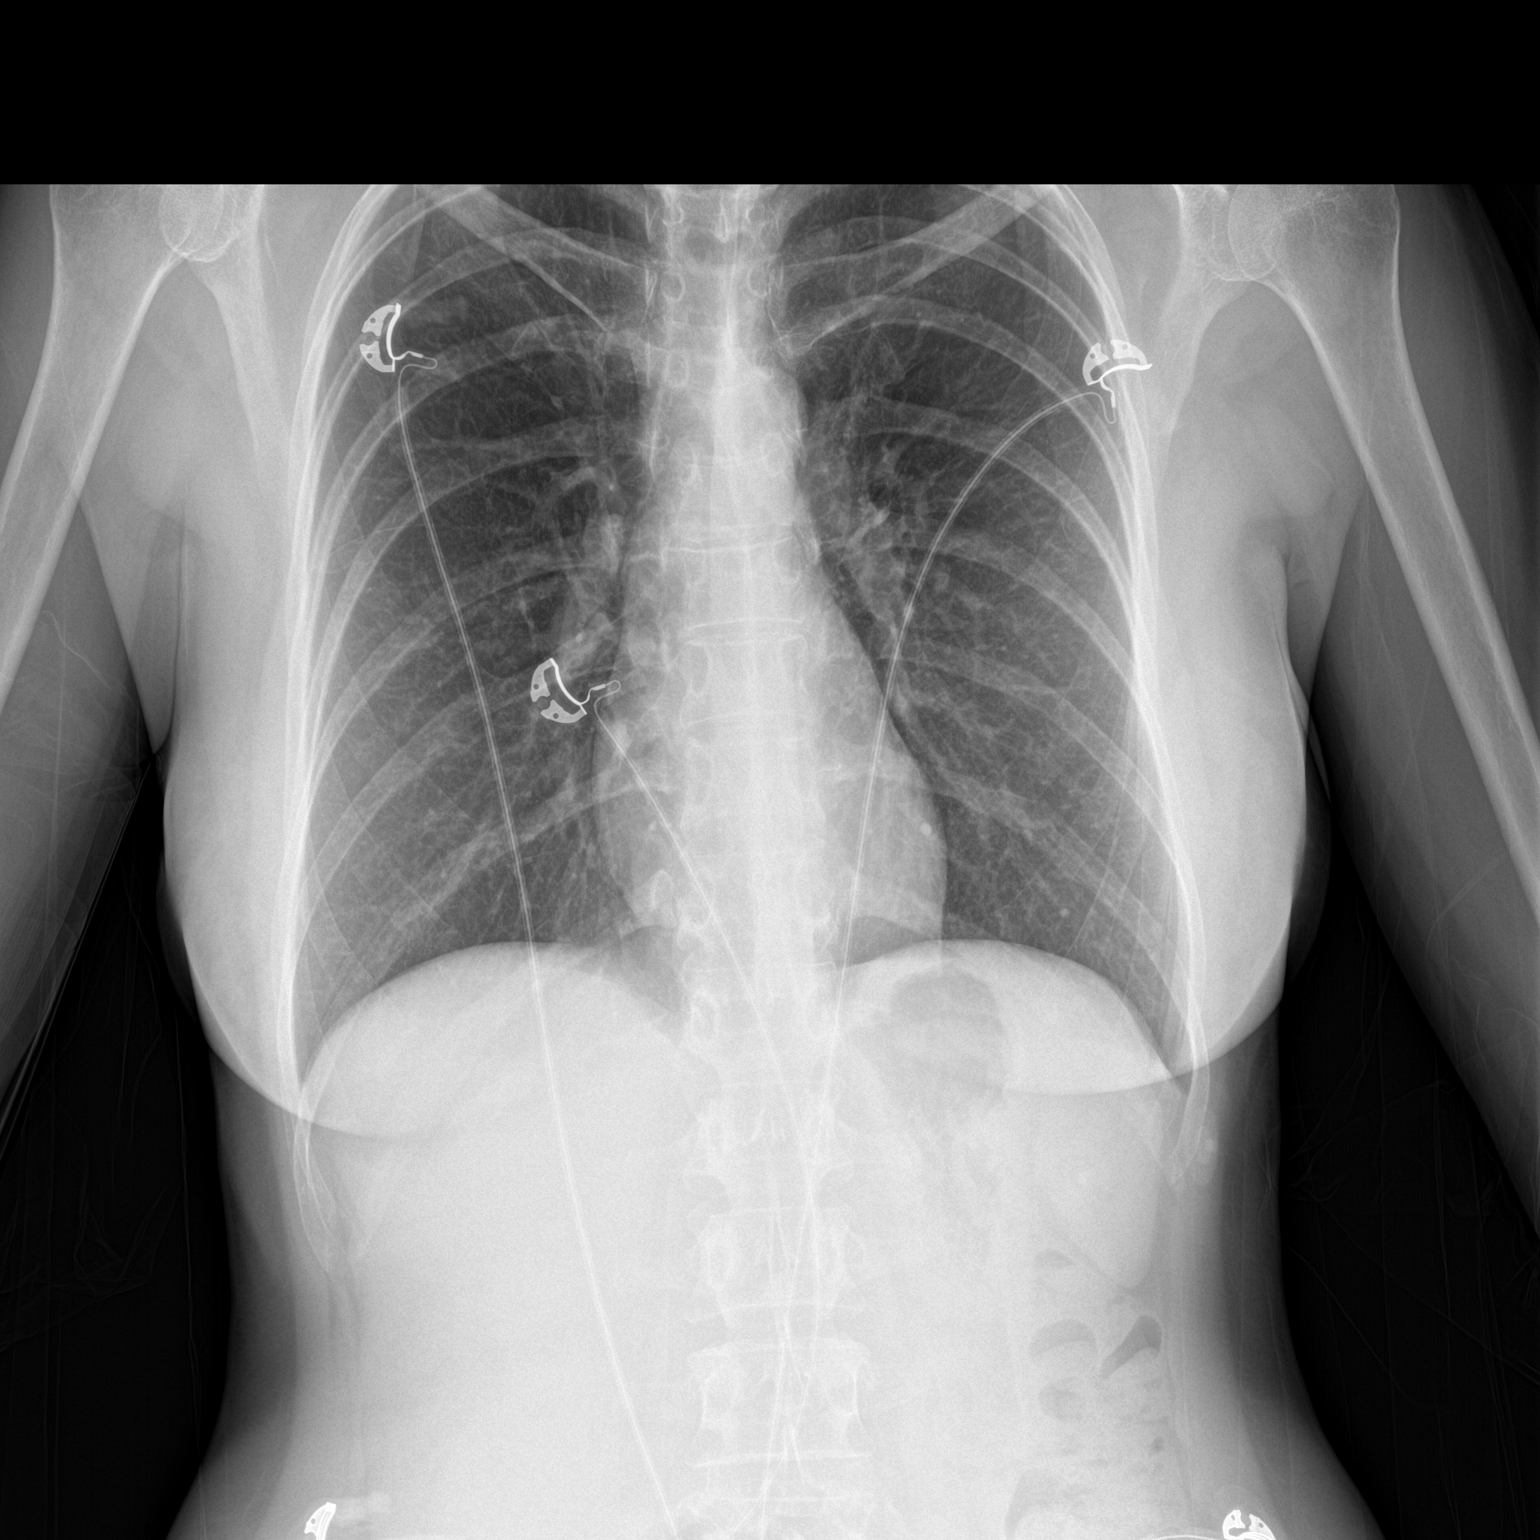

[chest lat]
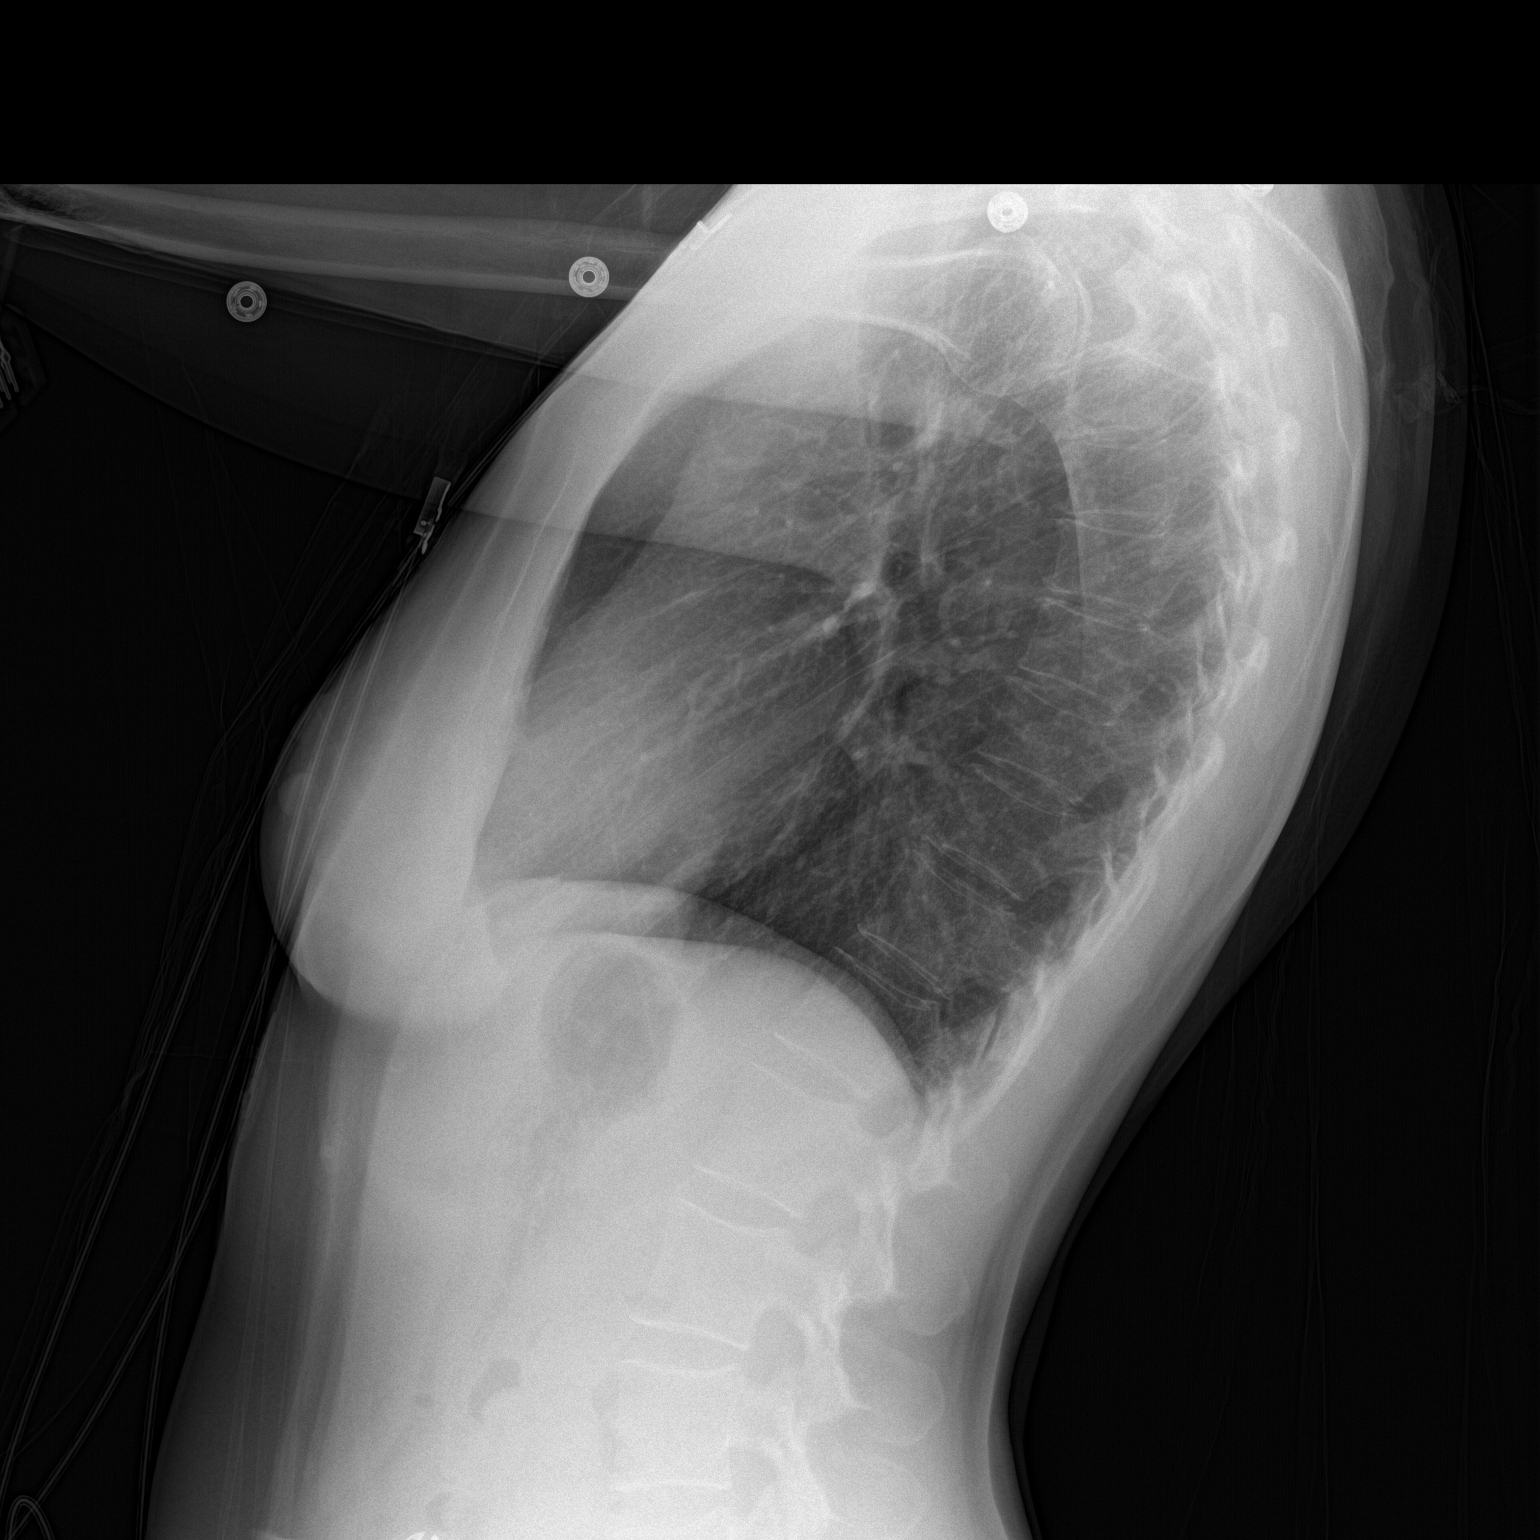

[2 of 2 positions shown; findings below may reference images not displayed]

FINDINGS: Normal mediastinum and cardiac silhouette. Normal pulmonary
vasculature. No evidence of effusion, infiltrate, or pneumothorax.
No acute bony abnormality.
IMPRESSION: Normal chest radiograph.

## 2016-05-30 IMAGING — CT CT ABD-PELV W/ CM
2 of 5 series · 16 of 46 positions shown, 18 images · IV contrast (OMNIPAQUE)
Comparison: CT scan 07/01/2006

CLINICAL DATA: Periumbilical abdominal pain since February 2014

EXAM:
CT ABDOMEN AND PELVIS WITH CONTRAST
TECHNIQUE: Multidetector CT imaging of the abdomen and pelvis was performed
using the standard protocol following bolus administration of
intravenous contrast.
CONTRAST:  50mL OMNIPAQUE IOHEXOL 300 MG/ML SOLN, 100mL OMNIPAQUE
IOHEXOL 300 MG/ML SOLN

[Series 2: rtn a/p with · axial · 0.64mm/px · z∈[-450,-95]mm · 13 of 79 slices shown, 15 images]
[im 4/79  soft-tissue]
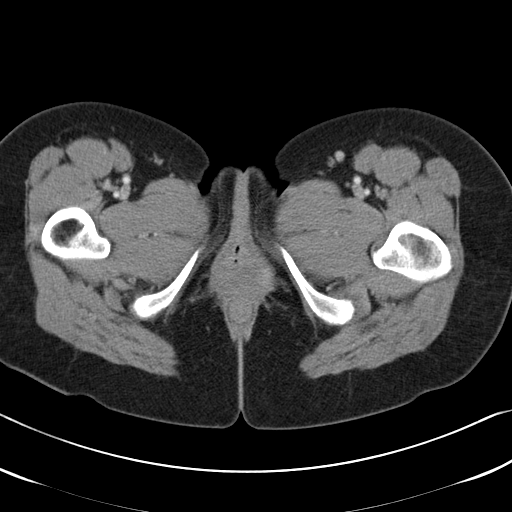
[im 4/79  bone]
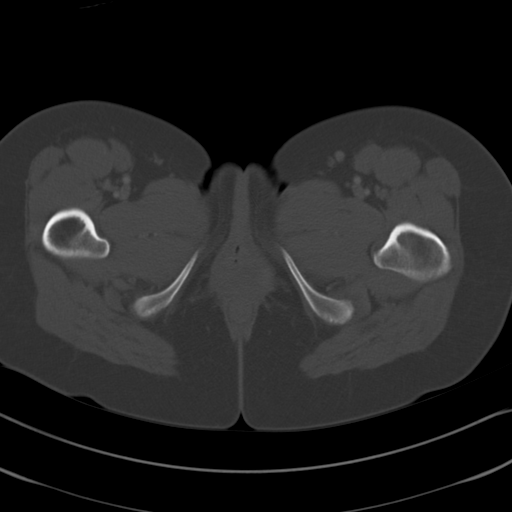
[im 12/79  soft-tissue]
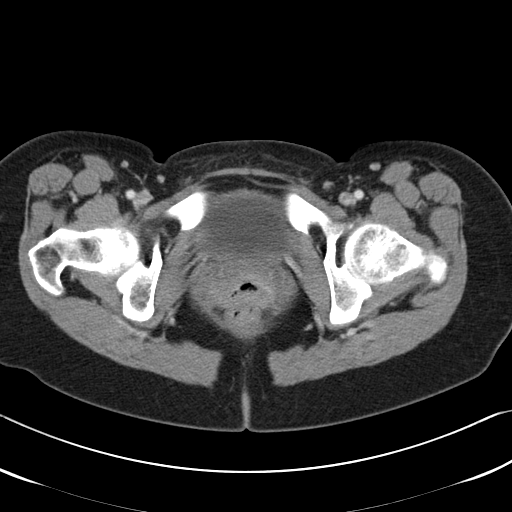
[im 16/79  soft-tissue]
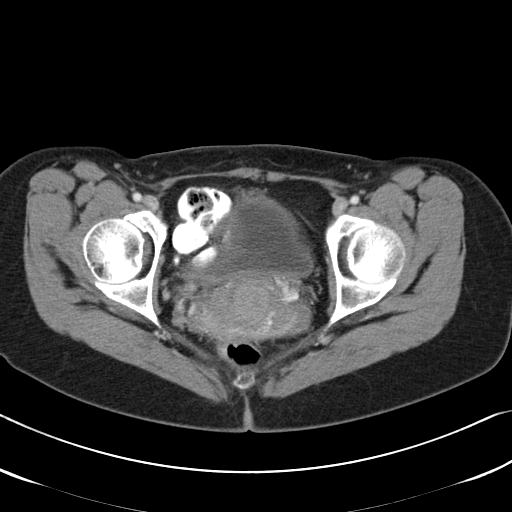
[im 24/79  soft-tissue]
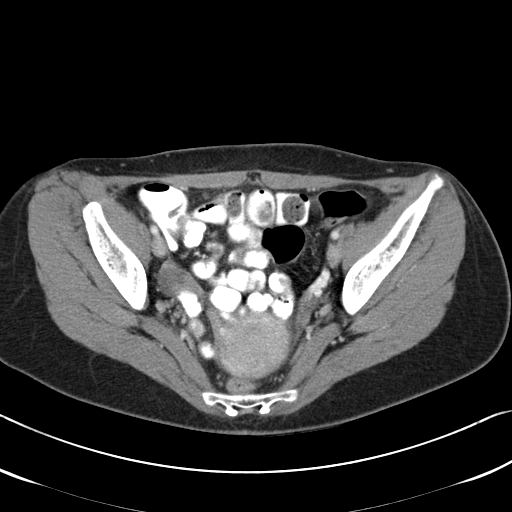
[im 28/79  soft-tissue]
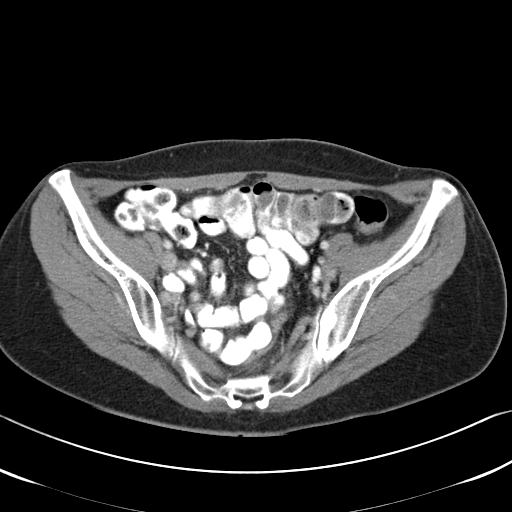
[im 36/79  soft-tissue]
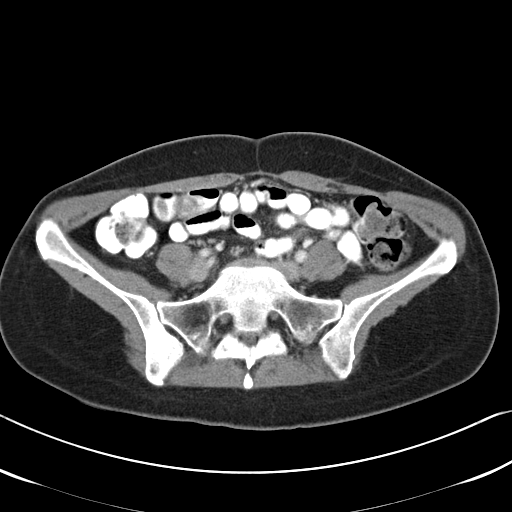
[im 40/79  soft-tissue]
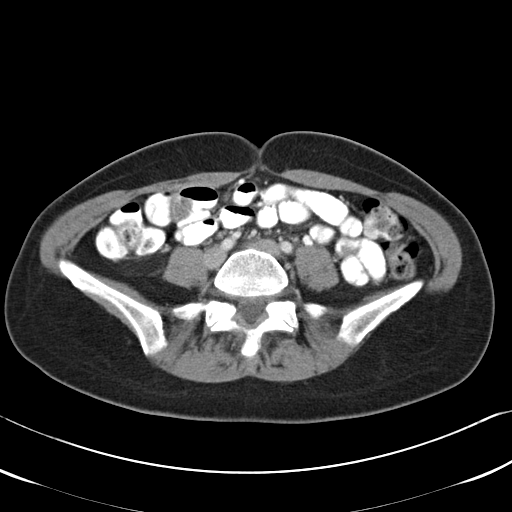
[im 43/79  soft-tissue]
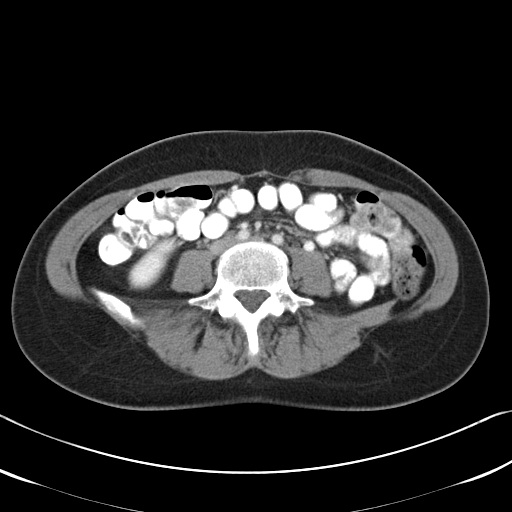
[im 51/79  soft-tissue]
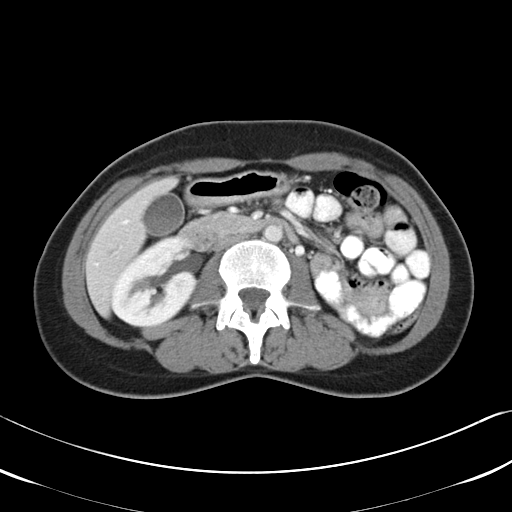
[im 51/79  bone]
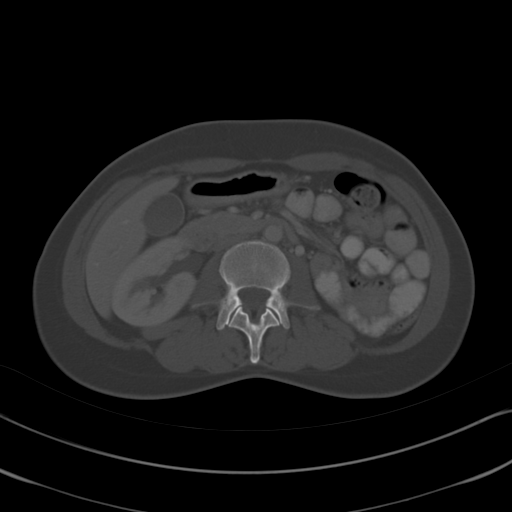
[im 55/79  soft-tissue]
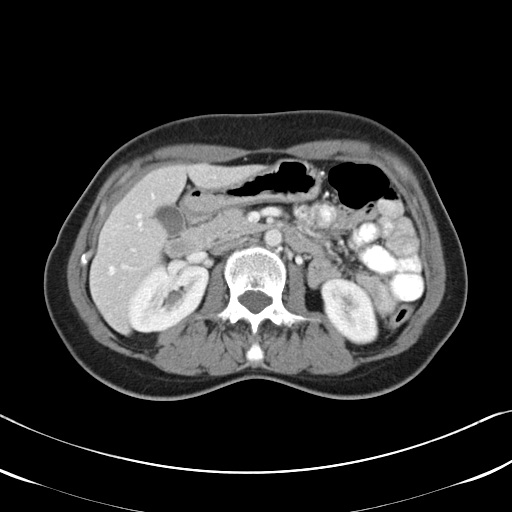
[im 63/79  soft-tissue]
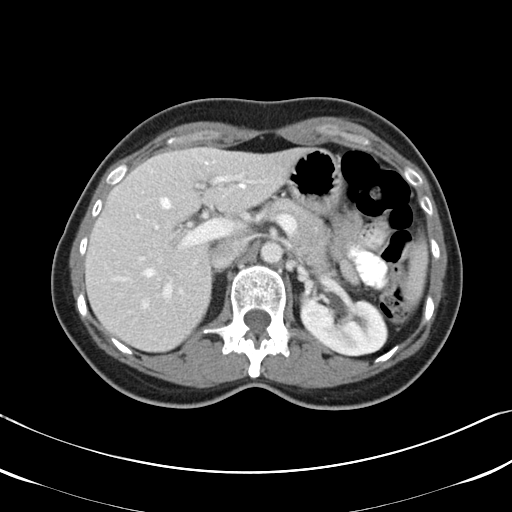
[im 67/79  soft-tissue]
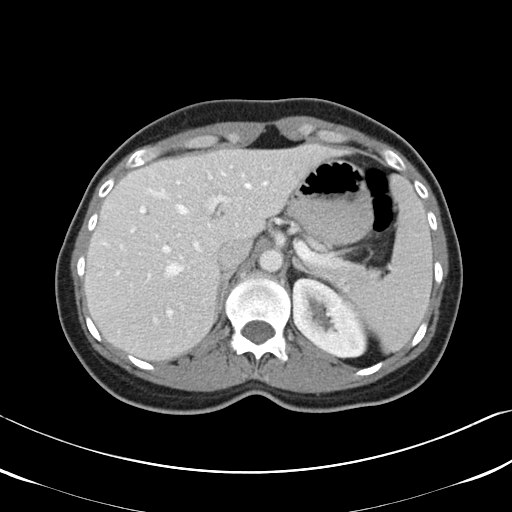
[im 75/79  soft-tissue]
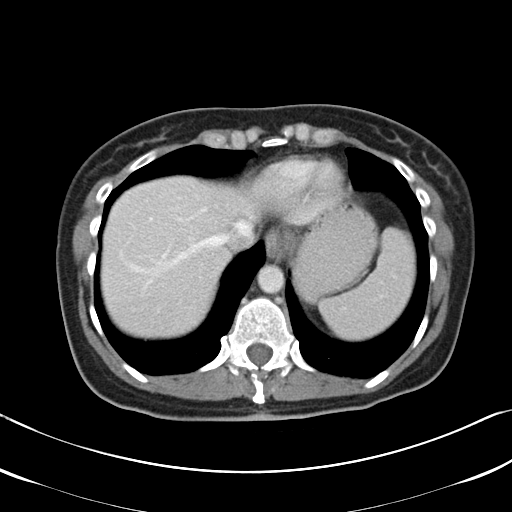

[Series 602: <mpr thick range> · coronal · 0.76mm/px · 3 of 67 slices shown]
[im 23/67  soft-tissue]
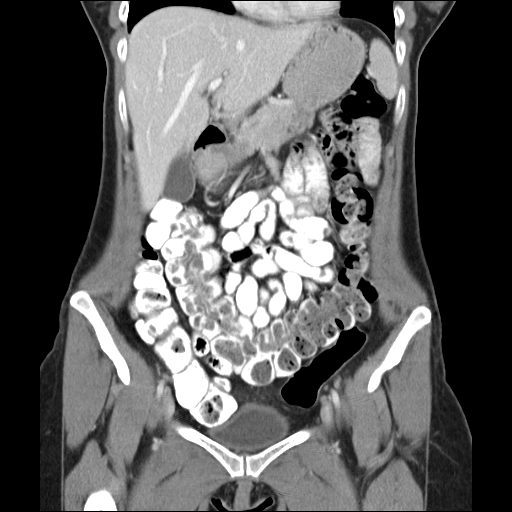
[im 30/67  soft-tissue]
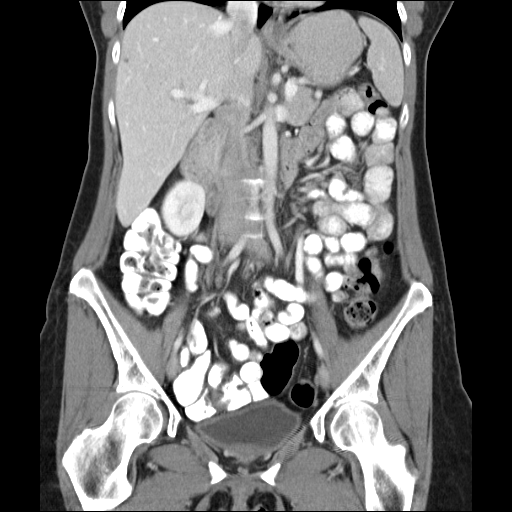
[im 37/67  soft-tissue]
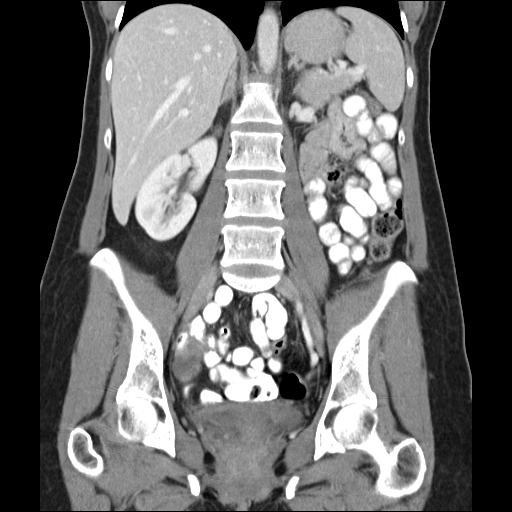

[16 of 46 positions shown; findings below may reference images not displayed]

FINDINGS: Lower chest: The lung bases are clear of acute process. No pleural
effusion or pulmonary lesions. The heart is normal in size. No
pericardial effusion. The distal esophagus and aorta are
unremarkable.

Hepatobiliary: 2 or 3 tiny low-attenuation lesions are likely a
benign cyst. There is also a small hepatic hemangioma noted in
segment 6 of the liver. No worrisome hepatic lesions or intrahepatic
biliary dilatation. The gallbladder is normal. No common bile duct
dilatation.

Pancreas: Normal

Spleen: Normal

Adrenals/Urinary Tract: Normal

Stomach/Bowel: The stomach, duodenum, small bowel and colon are
unremarkable. No inflammatory changes, mass lesions or obstructive
findings. The terminal ileum is normal. The appendix is normal. Low
lying cecum deep in the pelvis.

Vascular/Lymphatic: No mesenteric or retroperitoneal mass or
adenopathy. The aorta and branch vessels are normal. The major
venous structures are patent.

Reproductive: The uterus is retroverted. Both ovaries are normal.
The bladder is normal. No pelvic mass or adenopathy. No free pelvic
fluid collections. No inguinal mass or adenopathy.

Other: No free abdominal/pelvic fluid. No abdominal wall hernia or
subcutaneous lesions.

Musculoskeletal: No significant bony findings. There is a stable
area of mild sclerotic change involving the right iliac bone. This
has a somewhat ground-glass appearance and is likely fibrous
dysplasia.
IMPRESSION: 1. No acute abdominal/pelvic findings, mass lesions or adenopathy.
2. Small hepatic cysts and small hemangioma.
3. Stable area of mild sclerosis involving the right iliac bone is
likely fibrous dysplasia.

## 2016-08-12 DIAGNOSIS — H524 Presbyopia: Secondary | ICD-10-CM | POA: Diagnosis not present

## 2016-09-02 DIAGNOSIS — Z01419 Encounter for gynecological examination (general) (routine) without abnormal findings: Secondary | ICD-10-CM | POA: Diagnosis not present

## 2016-09-02 DIAGNOSIS — Z1231 Encounter for screening mammogram for malignant neoplasm of breast: Secondary | ICD-10-CM | POA: Diagnosis not present

## 2016-09-02 DIAGNOSIS — Z124 Encounter for screening for malignant neoplasm of cervix: Secondary | ICD-10-CM | POA: Diagnosis not present

## 2016-09-02 DIAGNOSIS — Z6827 Body mass index (BMI) 27.0-27.9, adult: Secondary | ICD-10-CM | POA: Diagnosis not present

## 2017-02-13 DIAGNOSIS — Z0000 Encounter for general adult medical examination without abnormal findings: Secondary | ICD-10-CM | POA: Diagnosis not present

## 2017-02-13 DIAGNOSIS — Z23 Encounter for immunization: Secondary | ICD-10-CM | POA: Diagnosis not present

## 2017-02-13 DIAGNOSIS — R140 Abdominal distension (gaseous): Secondary | ICD-10-CM | POA: Diagnosis not present

## 2017-02-13 DIAGNOSIS — J301 Allergic rhinitis due to pollen: Secondary | ICD-10-CM | POA: Diagnosis not present

## 2017-08-31 ENCOUNTER — Ambulatory Visit (INDEPENDENT_AMBULATORY_CARE_PROVIDER_SITE_OTHER): Payer: BLUE CROSS/BLUE SHIELD | Admitting: Otolaryngology

## 2017-08-31 DIAGNOSIS — J310 Chronic rhinitis: Secondary | ICD-10-CM

## 2017-08-31 DIAGNOSIS — J343 Hypertrophy of nasal turbinates: Secondary | ICD-10-CM

## 2017-08-31 DIAGNOSIS — J342 Deviated nasal septum: Secondary | ICD-10-CM

## 2017-09-02 ENCOUNTER — Other Ambulatory Visit (INDEPENDENT_AMBULATORY_CARE_PROVIDER_SITE_OTHER): Payer: Self-pay | Admitting: Otolaryngology

## 2017-09-02 DIAGNOSIS — J329 Chronic sinusitis, unspecified: Secondary | ICD-10-CM

## 2017-09-03 ENCOUNTER — Other Ambulatory Visit: Payer: BLUE CROSS/BLUE SHIELD

## 2017-09-03 ENCOUNTER — Ambulatory Visit
Admission: RE | Admit: 2017-09-03 | Discharge: 2017-09-03 | Disposition: A | Discharge status: Home / Self Care | Payer: BLUE CROSS/BLUE SHIELD | Source: Ambulatory Visit | Attending: Otolaryngology | Admitting: Otolaryngology

## 2017-09-03 DIAGNOSIS — J329 Chronic sinusitis, unspecified: Secondary | ICD-10-CM | POA: Diagnosis not present

## 2017-09-18 DIAGNOSIS — Z1231 Encounter for screening mammogram for malignant neoplasm of breast: Secondary | ICD-10-CM | POA: Diagnosis not present

## 2017-09-18 DIAGNOSIS — Z01419 Encounter for gynecological examination (general) (routine) without abnormal findings: Secondary | ICD-10-CM | POA: Diagnosis not present

## 2017-09-18 DIAGNOSIS — Z6821 Body mass index (BMI) 21.0-21.9, adult: Secondary | ICD-10-CM | POA: Diagnosis not present

## 2017-10-06 DIAGNOSIS — L821 Other seborrheic keratosis: Secondary | ICD-10-CM | POA: Diagnosis not present

## 2017-10-06 DIAGNOSIS — L820 Inflamed seborrheic keratosis: Secondary | ICD-10-CM | POA: Diagnosis not present

## 2018-02-17 DIAGNOSIS — Z23 Encounter for immunization: Secondary | ICD-10-CM | POA: Diagnosis not present

## 2018-02-17 DIAGNOSIS — Z0000 Encounter for general adult medical examination without abnormal findings: Secondary | ICD-10-CM | POA: Diagnosis not present

## 2018-02-17 DIAGNOSIS — J301 Allergic rhinitis due to pollen: Secondary | ICD-10-CM | POA: Diagnosis not present

## 2018-02-17 DIAGNOSIS — R140 Abdominal distension (gaseous): Secondary | ICD-10-CM | POA: Diagnosis not present

## 2018-02-17 DIAGNOSIS — Z1231 Encounter for screening mammogram for malignant neoplasm of breast: Secondary | ICD-10-CM | POA: Diagnosis not present

## 2018-03-08 DIAGNOSIS — M5442 Lumbago with sciatica, left side: Secondary | ICD-10-CM | POA: Diagnosis not present

## 2018-03-08 DIAGNOSIS — M545 Low back pain: Secondary | ICD-10-CM | POA: Diagnosis not present

## 2018-03-16 DIAGNOSIS — M545 Low back pain: Secondary | ICD-10-CM | POA: Diagnosis not present

## 2018-03-22 DIAGNOSIS — M5441 Lumbago with sciatica, right side: Secondary | ICD-10-CM | POA: Diagnosis not present

## 2018-03-31 DIAGNOSIS — M5416 Radiculopathy, lumbar region: Secondary | ICD-10-CM | POA: Diagnosis not present

## 2018-04-12 DIAGNOSIS — M5416 Radiculopathy, lumbar region: Secondary | ICD-10-CM | POA: Diagnosis not present

## 2018-11-26 DIAGNOSIS — Z1231 Encounter for screening mammogram for malignant neoplasm of breast: Secondary | ICD-10-CM | POA: Diagnosis not present

## 2018-11-28 HISTORY — PX: BREAST BIOPSY: SHX20

## 2018-11-30 ENCOUNTER — Other Ambulatory Visit: Payer: Self-pay | Admitting: Obstetrics and Gynecology

## 2018-11-30 DIAGNOSIS — R928 Other abnormal and inconclusive findings on diagnostic imaging of breast: Secondary | ICD-10-CM

## 2018-12-01 ENCOUNTER — Ambulatory Visit
Admission: RE | Admit: 2018-12-01 | Discharge: 2018-12-01 | Disposition: A | Discharge status: Home / Self Care | Payer: BC Managed Care – PPO | Source: Ambulatory Visit | Attending: Obstetrics and Gynecology | Admitting: Obstetrics and Gynecology

## 2018-12-01 ENCOUNTER — Other Ambulatory Visit: Payer: Self-pay | Admitting: Obstetrics and Gynecology

## 2018-12-01 ENCOUNTER — Other Ambulatory Visit: Payer: Self-pay

## 2018-12-01 DIAGNOSIS — R921 Mammographic calcification found on diagnostic imaging of breast: Secondary | ICD-10-CM | POA: Diagnosis not present

## 2018-12-01 DIAGNOSIS — R928 Other abnormal and inconclusive findings on diagnostic imaging of breast: Secondary | ICD-10-CM

## 2018-12-08 ENCOUNTER — Other Ambulatory Visit: Payer: Self-pay

## 2018-12-08 ENCOUNTER — Other Ambulatory Visit: Payer: Self-pay | Admitting: Obstetrics and Gynecology

## 2018-12-08 ENCOUNTER — Ambulatory Visit
Admission: RE | Admit: 2018-12-08 | Discharge: 2018-12-08 | Disposition: A | Discharge status: Home / Self Care | Payer: BC Managed Care – PPO | Source: Ambulatory Visit | Attending: Obstetrics and Gynecology | Admitting: Obstetrics and Gynecology

## 2018-12-08 DIAGNOSIS — D241 Benign neoplasm of right breast: Secondary | ICD-10-CM | POA: Diagnosis not present

## 2018-12-08 DIAGNOSIS — R928 Other abnormal and inconclusive findings on diagnostic imaging of breast: Secondary | ICD-10-CM

## 2018-12-08 DIAGNOSIS — R921 Mammographic calcification found on diagnostic imaging of breast: Secondary | ICD-10-CM | POA: Diagnosis not present

## 2018-12-15 DIAGNOSIS — Z6822 Body mass index (BMI) 22.0-22.9, adult: Secondary | ICD-10-CM | POA: Diagnosis not present

## 2018-12-15 DIAGNOSIS — Z01419 Encounter for gynecological examination (general) (routine) without abnormal findings: Secondary | ICD-10-CM | POA: Diagnosis not present

## 2019-01-28 HISTORY — PX: COLONOSCOPY WITH PROPOFOL: SHX5780

## 2019-02-04 DIAGNOSIS — Z20828 Contact with and (suspected) exposure to other viral communicable diseases: Secondary | ICD-10-CM | POA: Diagnosis not present

## 2019-08-15 DIAGNOSIS — Z13220 Encounter for screening for lipoid disorders: Secondary | ICD-10-CM | POA: Diagnosis not present

## 2019-08-15 DIAGNOSIS — Z0000 Encounter for general adult medical examination without abnormal findings: Secondary | ICD-10-CM | POA: Diagnosis not present

## 2019-10-10 DIAGNOSIS — Z1211 Encounter for screening for malignant neoplasm of colon: Secondary | ICD-10-CM | POA: Diagnosis not present

## 2019-10-10 DIAGNOSIS — Z8371 Family history of colonic polyps: Secondary | ICD-10-CM | POA: Diagnosis not present

## 2019-10-10 DIAGNOSIS — R140 Abdominal distension (gaseous): Secondary | ICD-10-CM | POA: Diagnosis not present

## 2019-12-26 DIAGNOSIS — Z1159 Encounter for screening for other viral diseases: Secondary | ICD-10-CM | POA: Diagnosis not present

## 2019-12-28 DIAGNOSIS — Z1211 Encounter for screening for malignant neoplasm of colon: Secondary | ICD-10-CM | POA: Diagnosis not present

## 2019-12-28 DIAGNOSIS — K648 Other hemorrhoids: Secondary | ICD-10-CM | POA: Diagnosis not present

## 2019-12-28 DIAGNOSIS — Z8371 Family history of colonic polyps: Secondary | ICD-10-CM | POA: Diagnosis not present

## 2020-01-03 DIAGNOSIS — Z1231 Encounter for screening mammogram for malignant neoplasm of breast: Secondary | ICD-10-CM | POA: Diagnosis not present

## 2020-01-03 DIAGNOSIS — Z01419 Encounter for gynecological examination (general) (routine) without abnormal findings: Secondary | ICD-10-CM | POA: Diagnosis not present

## 2020-01-10 ENCOUNTER — Other Ambulatory Visit: Payer: Self-pay | Admitting: Obstetrics and Gynecology

## 2020-01-10 DIAGNOSIS — R928 Other abnormal and inconclusive findings on diagnostic imaging of breast: Secondary | ICD-10-CM

## 2020-01-16 ENCOUNTER — Ambulatory Visit: Payer: BC Managed Care – PPO

## 2020-01-16 ENCOUNTER — Ambulatory Visit
Admission: RE | Admit: 2020-01-16 | Discharge: 2020-01-16 | Disposition: A | Discharge status: Home / Self Care | Payer: Self-pay | Source: Ambulatory Visit | Attending: Obstetrics and Gynecology | Admitting: Obstetrics and Gynecology

## 2020-01-16 ENCOUNTER — Other Ambulatory Visit: Payer: Self-pay

## 2020-01-16 DIAGNOSIS — R928 Other abnormal and inconclusive findings on diagnostic imaging of breast: Secondary | ICD-10-CM

## 2020-01-16 DIAGNOSIS — R922 Inconclusive mammogram: Secondary | ICD-10-CM | POA: Diagnosis not present

## 2021-02-22 DIAGNOSIS — Z1231 Encounter for screening mammogram for malignant neoplasm of breast: Secondary | ICD-10-CM | POA: Diagnosis not present

## 2021-03-05 DIAGNOSIS — Z01419 Encounter for gynecological examination (general) (routine) without abnormal findings: Secondary | ICD-10-CM | POA: Diagnosis not present

## 2021-03-05 DIAGNOSIS — Z124 Encounter for screening for malignant neoplasm of cervix: Secondary | ICD-10-CM | POA: Diagnosis not present

## 2021-04-09 DIAGNOSIS — N852 Hypertrophy of uterus: Secondary | ICD-10-CM | POA: Diagnosis not present

## 2021-06-13 DIAGNOSIS — Z3040 Encounter for surveillance of contraceptives, unspecified: Secondary | ICD-10-CM | POA: Diagnosis not present

## 2021-09-06 DIAGNOSIS — N920 Excessive and frequent menstruation with regular cycle: Secondary | ICD-10-CM | POA: Diagnosis not present

## 2021-09-06 DIAGNOSIS — D259 Leiomyoma of uterus, unspecified: Secondary | ICD-10-CM | POA: Diagnosis not present

## 2021-12-05 DIAGNOSIS — M5442 Lumbago with sciatica, left side: Secondary | ICD-10-CM | POA: Diagnosis not present

## 2021-12-16 DIAGNOSIS — M5441 Lumbago with sciatica, right side: Secondary | ICD-10-CM | POA: Diagnosis not present

## 2021-12-26 DIAGNOSIS — M545 Low back pain, unspecified: Secondary | ICD-10-CM | POA: Diagnosis not present

## 2022-03-04 DIAGNOSIS — Z1231 Encounter for screening mammogram for malignant neoplasm of breast: Secondary | ICD-10-CM | POA: Diagnosis not present

## 2022-03-04 DIAGNOSIS — Z01419 Encounter for gynecological examination (general) (routine) without abnormal findings: Secondary | ICD-10-CM | POA: Diagnosis not present

## 2022-03-04 DIAGNOSIS — R300 Dysuria: Secondary | ICD-10-CM | POA: Diagnosis not present

## 2022-03-04 DIAGNOSIS — Z6823 Body mass index (BMI) 23.0-23.9, adult: Secondary | ICD-10-CM | POA: Diagnosis not present

## 2022-03-25 DIAGNOSIS — Z0000 Encounter for general adult medical examination without abnormal findings: Secondary | ICD-10-CM | POA: Diagnosis not present

## 2022-03-25 DIAGNOSIS — D509 Iron deficiency anemia, unspecified: Secondary | ICD-10-CM | POA: Diagnosis not present

## 2022-03-25 DIAGNOSIS — R300 Dysuria: Secondary | ICD-10-CM | POA: Diagnosis not present

## 2022-03-25 DIAGNOSIS — M5430 Sciatica, unspecified side: Secondary | ICD-10-CM | POA: Diagnosis not present

## 2022-03-25 DIAGNOSIS — J301 Allergic rhinitis due to pollen: Secondary | ICD-10-CM | POA: Diagnosis not present

## 2022-06-02 DIAGNOSIS — M5442 Lumbago with sciatica, left side: Secondary | ICD-10-CM | POA: Diagnosis not present

## 2022-06-04 DIAGNOSIS — M5416 Radiculopathy, lumbar region: Secondary | ICD-10-CM | POA: Diagnosis not present

## 2022-10-20 DIAGNOSIS — T7840XA Allergy, unspecified, initial encounter: Secondary | ICD-10-CM | POA: Diagnosis not present

## 2022-10-20 DIAGNOSIS — R079 Chest pain, unspecified: Secondary | ICD-10-CM | POA: Diagnosis not present

## 2022-10-20 DIAGNOSIS — R208 Other disturbances of skin sensation: Secondary | ICD-10-CM | POA: Diagnosis not present

## 2022-10-20 DIAGNOSIS — Z6821 Body mass index (BMI) 21.0-21.9, adult: Secondary | ICD-10-CM | POA: Diagnosis not present

## 2022-10-20 DIAGNOSIS — N959 Unspecified menopausal and perimenopausal disorder: Secondary | ICD-10-CM | POA: Diagnosis not present

## 2022-10-24 DIAGNOSIS — L509 Urticaria, unspecified: Secondary | ICD-10-CM | POA: Diagnosis not present

## 2022-10-24 DIAGNOSIS — J029 Acute pharyngitis, unspecified: Secondary | ICD-10-CM | POA: Diagnosis not present

## 2022-10-24 DIAGNOSIS — K900 Celiac disease: Secondary | ICD-10-CM | POA: Diagnosis not present

## 2022-10-24 DIAGNOSIS — J9801 Acute bronchospasm: Secondary | ICD-10-CM | POA: Diagnosis not present

## 2022-11-20 DIAGNOSIS — N939 Abnormal uterine and vaginal bleeding, unspecified: Secondary | ICD-10-CM | POA: Diagnosis not present

## 2022-11-20 DIAGNOSIS — R52 Pain, unspecified: Secondary | ICD-10-CM | POA: Diagnosis not present

## 2022-12-09 ENCOUNTER — Other Ambulatory Visit: Payer: Self-pay

## 2022-12-09 ENCOUNTER — Ambulatory Visit: Payer: BC Managed Care – PPO | Admitting: Allergy & Immunology

## 2022-12-09 ENCOUNTER — Encounter: Payer: Self-pay | Admitting: Allergy & Immunology

## 2022-12-09 VITALS — BP 138/82 | HR 82 | Temp 98.1°F | Resp 20 | Ht 62.21 in | Wt 122.7 lb

## 2022-12-09 DIAGNOSIS — K9041 Non-celiac gluten sensitivity: Secondary | ICD-10-CM

## 2022-12-09 DIAGNOSIS — J302 Other seasonal allergic rhinitis: Secondary | ICD-10-CM

## 2022-12-09 DIAGNOSIS — K2100 Gastro-esophageal reflux disease with esophagitis, without bleeding: Secondary | ICD-10-CM | POA: Diagnosis not present

## 2022-12-09 DIAGNOSIS — L299 Pruritus, unspecified: Secondary | ICD-10-CM | POA: Diagnosis not present

## 2022-12-09 DIAGNOSIS — R109 Unspecified abdominal pain: Secondary | ICD-10-CM | POA: Diagnosis not present

## 2022-12-09 MED ORDER — HYDROXYZINE HCL 25 MG PO TABS: 25.0000 mg | ORAL_TABLET | Freq: Every day | ORAL | 1 refills | Status: AC

## 2022-12-09 NOTE — Progress Notes (Signed)
NEW PATIENT  Date of Service/Encounter:  12/09/22  Consult requested by: Lorenda Ishihara, MD   Assessment:   Gluten intolerance  Pruritus  Seasonal allergic rhinitis  Plan/Recommendations:   1. Gluten intolerance - We are going to get some labs to look for IgE (allergy antibodies) to gluten containing grains. - We are also going to get the genetic test to look Celiac disease.  - The testing you had would be artificially negative if you were avoiding gluten at the time of the testing.   2. Pruritus - Continue with hydroxyzine as needed. - We can do additional testing in the future for other foods if needed, but it seems that you have a good handle on your symptoms.  3. Environmental allergy testing - We can do skin testing at the next visit if needed. - We can do more testing in the future if needed.   4. Return in about 4 weeks (around 01/06/2023). You can have the follow up appointment with Dr. Dellis Anes or a Nurse Practicioner (our Nurse Practitioners are excellent and always have Physician oversight!).    This note in its entirety was forwarded to the Provider who requested this consultation.  Subjective:   Kristy Hancock is a 52 y.o. female presenting today for evaluation of  Chief Complaint  Patient presents with   Allergic Reaction    States that she may have a gluten allergy with digestion issues States that chest would become tight with pain hard to swallow sore tender throat due to gluten.   Pruritus    Kristy Hancock has a history of the following: Patient Active Problem List   Diagnosis Date Noted   Uses oral contraception 08/10/2015   Neck pain, bilateral 08/09/2015   Right ear pain 08/09/2015    History obtained from: chart review and patient.  Discussed the use of AI scribe software for clinical note transcription with the patient and/or guardian, who gave verbal consent to proceed.  Kristy Hancock was referred by Lorenda Ishihara, MD.     Kristy Hancock  is a 52 y.o. female presenting for an evaluation of concern for a gluten sensitivity .  Kristy Hancock has a known gluten allergy, presents with an escalation in symptoms over the past few weeks. Initially diagnosed 15 years ago, the patient's symptoms were primarily digestive, including bloating and discomfort lasting up to a month. However, recent episodes have been more acute, occurring within 30 minutes of gluten exposure. Symptoms now include chest pressure, difficulty swallowing, and throat tightness. The patient also reports occasional itching but denies any rashes or diarrhea. She does not have an EpiPen.   The patient has been diligent in maintaining a gluten-free diet, but suspects inadvertent exposure to gluten through products not explicitly labeled as gluten-free. Since ensuring all consumed products are labeled gluten-free, the patient reports no further episodes. She has felt much better since getting on a gluten free diet.  Her sister apparently has true celiac disease and is the first 1 who recommended avoiding gluten to see if it would help with her symptoms.  The patient also mentions a history of sensitivity to certain soaps and lotions as an infant, which may have resurfaced recently, causing generalized itching. This has improved with the use of hypoallergenic products.  The patient's primary concern is the sudden and severe reaction to gluten exposure, which differs significantly from her previous experiences. She is interested in further testing to confirm the gluten allergy and to rule out other potential allergens.  She did have testing done by her PCP shown below which was negative.  She has never had any kind of skin manifestations from her gluten sensitivity.  Otherwise, there is no history of other atopic diseases, including drug allergies, stinging insect allergies, or contact dermatitis. There is no significant infectious history. Vaccinations are up to date.    Past Medical  History: Patient Active Problem List   Diagnosis Date Noted   Uses oral contraception 08/10/2015   Neck pain, bilateral 08/09/2015   Right ear pain 08/09/2015    Medication List:  Allergies as of 12/09/2022       Reactions   Oxycodone Itching        Medication List        Accurate as of December 09, 2022  6:40 PM. If you have any questions, ask your nurse or doctor.          STOP taking these medications    albuterol 108 (90 Base) MCG/ACT inhaler Commonly known as: VENTOLIN HFA Stopped by: Alfonse Spruce   cyclobenzaprine 10 MG tablet Commonly known as: FLEXERIL Stopped by: Alfonse Spruce   gabapentin 300 MG capsule Commonly known as: NEURONTIN Stopped by: Alfonse Spruce   hydroxypropyl methylcellulose / hypromellose 2.5 % ophthalmic solution Commonly known as: ISOPTO TEARS / GONIOVISC Stopped by: Alfonse Spruce       TAKE these medications    Ascorbic Acid 500 MG Caps as directed Orally   cholecalciferol 1000 units tablet Commonly known as: VITAMIN D Take 1,000 Units by mouth daily.   famotidine 20 MG tablet Commonly known as: PEPCID famotidine 20 mg tablet  TAKE 1 TABLET BY MOUTH TWICE A DAY   FISH OIL PO Take 1 capsule by mouth daily.   Hailey 24 Fe 1-20 MG-MCG(24) tablet Generic drug: Norethindrone Acetate-Ethinyl Estrad-FE 1 tablet Orally Once a day   hydrOXYzine 25 MG tablet Commonly known as: ATARAX Take 1 tablet (25 mg total) by mouth at bedtime.   ibuprofen 200 MG tablet Commonly known as: ADVIL Take 400-600 mg by mouth every 6 (six) hours as needed for headache or moderate pain.   PROBIOTIC PO Take 1-2 capsules by mouth daily.   Tri-Lo-Sprintec 0.18/0.215/0.25 MG-25 MCG tab Generic drug: Norgestimate-Ethinyl Estradiol Triphasic TAKE 1 TABLET(S) EVERY DAY BY ORAL ROUTE.   Vitamin B Complex Tabs as directed Orally        Birth History: non-contributory  Developmental History:  non-contributory  Past Surgical History: Past Surgical History:  Procedure Laterality Date   BREAST BIOPSY Right 11/2018   CESAREAN SECTION  2002   CHOLECYSTECTOMY     TYMPANOSTOMY TUBE PLACEMENT     VAGINAL BIRTH AFTER CESAREAN SECTION  2009     Family History: Family History  Problem Relation Age of Onset   Hypertension Mother    Allergic rhinitis Father    Hearing loss Father    Ovarian cancer Paternal Grandmother    Cancer Paternal Grandmother        breast, OVARIAN   Heart disease Paternal Grandfather      Social History: Roena lives at home with her family.  She lives in a house that is 52 years old.  There is laminate wood flooring in the main living areas and carpeting in the bedroom.  She has a heat pump for heating and cooling with supplemental electric heating.  She has 2 cats and 1 dog inside of the home.  There are chickens outside of the home.  There  are dust mite covers on the pillows, but not the bedding.  There is no tobacco exposure.  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   Review of systems otherwise negative other than that mentioned in the HPI.    Objective:   Blood pressure 138/82, pulse 82, temperature 98.1 F (36.7 C), resp. rate 20, height 5' 2.21" (1.58 m), weight 122 lb 11.2 oz (55.7 kg), SpO2 99%. Body mass index is 22.29 kg/m.     Physical Exam Vitals reviewed.  Constitutional:      Appearance: Normal appearance. She is well-developed.     Comments: Talkative.  HENT:     Head: Normocephalic and atraumatic.     Right Ear: Tympanic membrane, ear canal and external ear normal. No drainage, swelling or tenderness. Tympanic membrane is not injected, scarred, erythematous, retracted or bulging.     Left Ear: Tympanic membrane, ear canal and external ear normal. No drainage, swelling or tenderness. Tympanic membrane is not injected, scarred, erythematous, retracted or bulging.     Nose: No nasal deformity, septal  deviation, mucosal edema or rhinorrhea.     Right Turbinates: Enlarged, swollen and pale.     Left Turbinates: Enlarged, swollen and pale.     Right Sinus: No maxillary sinus tenderness or frontal sinus tenderness.     Left Sinus: No maxillary sinus tenderness or frontal sinus tenderness.     Mouth/Throat:     Mouth: Mucous membranes are not pale and not dry.     Pharynx: Uvula midline.  Eyes:     General:        Right eye: No discharge.        Left eye: No discharge.     Conjunctiva/sclera: Conjunctivae normal.     Right eye: Right conjunctiva is not injected. No chemosis.    Left eye: Left conjunctiva is not injected. No chemosis.    Pupils: Pupils are equal, round, and reactive to light.  Cardiovascular:     Rate and Rhythm: Normal rate and regular rhythm.     Heart sounds: Normal heart sounds.  Pulmonary:     Effort: Pulmonary effort is normal. No tachypnea, accessory muscle usage or respiratory distress.     Breath sounds: Normal breath sounds. No wheezing, rhonchi or rales.  Chest:     Chest wall: No tenderness.  Abdominal:     Tenderness: There is no abdominal tenderness. There is no guarding or rebound.  Lymphadenopathy:     Head:     Right side of head: No submandibular, tonsillar or occipital adenopathy.     Left side of head: No submandibular, tonsillar or occipital adenopathy.     Cervical: No cervical adenopathy.  Skin:    Coloration: Skin is not pale.     Findings: No abrasion, erythema, petechiae or rash. Rash is not papular, urticarial or vesicular.  Neurological:     Mental Status: She is alert.  Psychiatric:        Behavior: Behavior is cooperative.      Diagnostic studies: labs sent instead          Malachi Bonds, MD [facility]

## 2022-12-09 NOTE — Patient Instructions (Addendum)
1. Gluten intolerance - We are going to get some labs to look for IgE (allergy antibodies) to gluten containing grains. - We are also going to get the genetic test to look Celiac disease.  - The testing you had would be artificially negative if you were avoiding gluten at the time of the testing.   2. Pruritus - Continue with hydroxyzine as needed. - We can do additional testing in the future for other foods if needed, but it seems that you have a good handle on your symptoms.  3. Environmental allergy testing - We can do skin testing at the next visit if needed. - We can do more testing in the future if needed.   4. Return in about 4 weeks (around 01/06/2023). You can have the follow up appointment with Dr. Dellis Anes or a Nurse Practicioner (our Nurse Practitioners are excellent and always have Physician oversight!).    Please inform us of any Emergency Department visits, hospitalizations, or changes in symptoms. Call us before going to the ED for breathing or allergy symptoms since we might be able to fit you in for a sick visit. Feel free to contact us anytime with any questions, problems, or concerns.  It was a pleasure to meet you today!  Websites that have reliable patient information: 1. American Academy of Asthma, Allergy, and Immunology: www.aaaai.org 2. Food Allergy Research and Education (FARE): foodallergy.org 3. Mothers of Asthmatics: http://www.asthmacommunitynetwork.org 4. American College of Allergy, Asthma, and Immunology: www.acaai.org      "Like" [place] on Facebook and Instagram for our latest updates!      A healthy democracy works best when [employer] participate! Make sure you are registered to vote! If you have moved or changed any of your contact information, you will need to get this updated before voting! Scan the QR codes below to learn more!

## 2022-12-18 LAB — CELIAC DISEASE HLA DQ ASSOC.
DQ2 (DQA1 0501/0505,DQB1 02XX): NEGATIVE
DQ8 (DQA1 03XX, DQB1 0302): POSITIVE

## 2022-12-18 LAB — ALLERGEN BARLEY F6: Allergen Barley IgE: <0.1 kU/L

## 2022-12-18 LAB — ALLERGEN, RYE, F5: Rye IgE: <0.1 kU/L

## 2022-12-18 LAB — TRYPTASE: Tryptase: 6.3 ug/L (ref 2.2–13.2)

## 2022-12-18 LAB — ALLERGEN GLUTEN F79: Allergen Gluten IgE: <0.1 kU/L

## 2022-12-18 LAB — ALLERGEN, WHEAT, F4: Wheat IgE: <0.1 kU/L

## 2023-01-06 ENCOUNTER — Ambulatory Visit: Payer: BC Managed Care – PPO | Admitting: Allergy & Immunology

## 2023-01-13 ENCOUNTER — Encounter: Payer: Self-pay | Admitting: Allergy & Immunology

## 2023-01-13 ENCOUNTER — Ambulatory Visit: Payer: BC Managed Care – PPO | Admitting: Allergy & Immunology

## 2023-01-13 DIAGNOSIS — L299 Pruritus, unspecified: Secondary | ICD-10-CM

## 2023-01-13 DIAGNOSIS — J302 Other seasonal allergic rhinitis: Secondary | ICD-10-CM

## 2023-01-13 DIAGNOSIS — K9041 Non-celiac gluten sensitivity: Secondary | ICD-10-CM

## 2023-01-13 NOTE — Patient Instructions (Addendum)
1. Gluten intolerance - You had the genetic test that shows that you are predisposed to have Celiac disease. - Testing was NEGATIVE, while rules out a gluten allergy (IgE mediated allergic reaction). - This means that you do not have NEED epinephrine to deal with the reactions.  - You can try a cupcake at home with wheat and see how you do.    2. Pruritus - Continue with hydroxyzine as needed.  3. Environmental allergy testing - Testing today showed: trees and indoor molds - Copy of test results provided.  - Avoidance measures provided. - Continue with: an antihistamine as needed  4. Return if symptoms worsen or fail to improve. You can have the follow up appointment with Dr. Dellis Anes or a Nurse Practicioner (our Nurse Practitioners are excellent and always have Physician oversight!).    Please inform us of any Emergency Department visits, hospitalizations, or changes in symptoms. Call us before going to the ED for breathing or allergy symptoms since we might be able to fit you in for a sick visit. Feel free to contact us anytime with any questions, problems, or concerns.  It was a pleasure to meet you today!  Websites that have reliable patient information: 1. American Academy of Asthma, Allergy, and Immunology: www.aaaai.org 2. Food Allergy Research and Education (FARE): foodallergy.org 3. Mothers of Asthmatics: http://www.asthmacommunitynetwork.org 4. American College of Allergy, Asthma, and Immunology: www.acaai.org      "Like" [place] on Facebook and Instagram for our latest updates!      A healthy democracy works best when [employer] participate! Make sure you are registered to vote! If you have moved or changed any of your contact information, you will need to get this updated before voting! Scan the QR codes below to learn more!         Airborne Adult Perc - 01/13/23 1404     Time Antigen Placed 1410    Allergen Manufacturer Waynette Buttery    Location Back    Number of Test  55    1. Control-Buffer 50% Glycerol Negative    2. Control-Histamine 2+    3. Bahia Negative    4. French Southern Territories Negative    5. Johnson Negative    6. Kentucky Blue Negative    7. Meadow Fescue Negative    8. Perennial Rye Negative    9. Timothy Negative    10. Ragweed Mix Negative    11. Cocklebur Negative    12. Plantain,  English Negative    13. Baccharis Negative    14. Dog Fennel Negative    15. Russian Thistle Negative    16. Lamb's Quarters Negative    17. Sheep Sorrell Negative    18. Rough Pigweed Negative    19. Marsh Elder, Rough Negative    20. Mugwort, Common Negative    21. Box, Elder Negative    22. Cedar, red Negative    23. Sweet Gum Negative    24. Pecan Pollen Negative    25. Pine Mix Negative    26. Walnut, Black Pollen Negative    27. Red Mulberry 2+    28. Ash Mix Negative    29. Birch Mix Negative    30. Beech American Negative    31. Cottonwood, Guinea-Bissau Negative    32. Hickory, White Negative    33. Maple Mix Negative    34. Oak, [place] Mix Negative    35. Sycamore Eastern Negative    36. Alternaria Alternata Negative    37. Cladosporium  Herbarum Negative    38. Aspergillus Mix Negative    39. Penicillium Mix 2+    40. Bipolaris Sorokiniana (Helminthosporium) Negative    41. Drechslera Spicifera (Curvularia) Negative    42. Mucor Plumbeus Negative    43. Fusarium Moniliforme Negative    44. Aureobasidium Pullulans (pullulara) Negative    45. Rhizopus Oryzae Negative    46. Botrytis Cinera Negative    47. Epicoccum Nigrum Negative    48. Phoma Betae Negative    49. Dust Mite Mix Negative    50. Cat Hair 10,000 BAU/ml Negative    51.  Dog Epithelia Negative    52. Mixed Feathers Negative    53. Horse Epithelia Negative    54. Cockroach, German Negative    55. Tobacco Leaf Negative             13 Food Perc - 01/13/23 1404       Test Information   Time Antigen Placed 1410    Allergen Manufacturer Waynette Buttery    Location Back    Number of  allergen test 1      Food   3. Wheat Negative             Intradermal - 01/13/23 1445     Time Antigen Placed 1445    Allergen Manufacturer Waynette Buttery    Location Arm    Number of Test 15    Control Negative    Bahia Negative    French Southern Territories Negative    Johnson Negative    7 Grass Negative    Ragweed Mix Negative    Weed Mix Negative    Tree Mix Negative    Mold 1 Negative    Mold 3 Negative    Mold 4 Negative    Mite Mix Negative    Cat Negative    Dog Negative    Cockroach Negative

## 2023-01-13 NOTE — Progress Notes (Signed)
FOLLOW UP  Date of Service/Encounter:  01/13/23   Assessment:   Pruritus  Seasonal allergic rhinitis (trees and indoor molds)  Gluten intolerance - with a genetic testing showing a predisposition to development of Celiac disease  Plan/Recommendations:   1. Gluten intolerance - You had the genetic test that shows that you are predisposed to have Celiac disease. - Testing was NEGATIVE, while rules out a gluten allergy (IgE mediated allergic reaction). - This means that you do not have NEED epinephrine to deal with the reactions.  - You can try a cupcake at home with wheat and see how you do.    2. Pruritus - Continue with hydroxyzine as needed.  3. Environmental allergy testing - Testing today showed: trees and indoor molds - Copy of test results provided.  - Avoidance measures provided. - Continue with: an antihistamine as needed  4. Return if symptoms worsen or fail to improve. You can have the follow up appointment with Dr. Dellis Anes or a Nurse Practicioner (our Nurse Practitioners are excellent and always have Physician oversight!).   Subjective:   Kristy Hancock is a 52 y.o. female presenting today for follow up of No chief complaint on file.   Kristy Hancock has a history of the following: Patient Active Problem List   Diagnosis Date Noted   Uses oral contraception 08/10/2015   Neck pain, bilateral 08/09/2015   Right ear pain 08/09/2015    History obtained from: chart review and patient.  Discussed the use of AI scribe software for clinical note transcription with the patient and/or guardian, who gave verbal consent to proceed.  Kristy Hancock is a 52 y.o. female presenting for skin testing. She was last seen on November 12.  At that time, we did get some lab work to look for IgE to gluten containing grains.  We also obtained a genetic test to look for celiac disease.  We could not do testing because her insurance company does not cover testing on the same day as a New Patient  visit. She has been off of all antihistamines 3 days in anticipation of the testing.   Otherwise, there have been no changes to her past medical history, surgical history, family history, or social history.    Review of systems otherwise negative other than that mentioned in the HPI.    Objective:   There were no vitals taken for this visit. There is no height or weight on file to calculate BMI.    Physical exam deferred since this was a skin testing appointment only.   Diagnostic studies:   Allergy Studies:     Airborne Adult Perc - 01/13/23 1404     Time Antigen Placed 1410    Allergen Manufacturer Waynette Buttery    Location Back    Number of Test 55    1. Control-Buffer 50% Glycerol Negative    2. Control-Histamine 2+    3. Bahia Negative    4. [place] Territ[place] Negative    5. Johnson Negative    6. Kentucky Blue Negative    7. Meadow Fescue Negative    8. Perennial Rye Negative    9. Timothy Negative    10. Ragweed Mix Negative    11. Cocklebur Negative    12. Plantain,  English Negative    13. Baccharis Negative    14. Dog Fennel Negative    15. Russian Thistle Negative    16. Lamb's Quarters Negative    17. Sheep Sorrell Negative    18. Rough Pigweed  Negative    19. Marsh Elder, Rough Negative    20. Mugwort, Common Negative    21. Box, Elder Negative    22. Cedar, red Negative    23. Sweet Gum Negative    24. Pecan Pollen Negative    25. Pine Mix Negative    26. Walnut, Black Pollen Negative    27. Red Mulberry 2+    28. Ash Mix Negative    29. Birch Mix Negative    30. Beech American Negative    31. Cottonwood, Guinea-[place]issau Negative    32. Hickory, White Negative    33. Maple Mix Negative    34. Oak, [place] Mix Negative    35. Sycamore Eastern Negative    36. Alternaria Alternata Negative    37. Cladosporium Herbarum Negative    38. Aspergillus Mix Negative    39. Penicillium Mix 2+    40. Bipolaris Sorokiniana (Helminthosporium) Negative    41. Drechslera  Spicifera (Curvularia) Negative    42. Mucor Plumbeus Negative    43. Fusarium Moniliforme Negative    44. Aureobasidium Pullulans (pullulara) Negative    45. Rhizopus Oryzae Negative    46. Botrytis Cinera Negative    47. Epicoccum Nigrum Negative    48. Phoma Betae Negative    49. Dust Mite Mix Negative    50. Cat Hair 10,000 BAU/ml Negative    51.  Dog Epithelia Negative    52. Mixed Feathers Negative    53. Horse Epithelia Negative    54. Cockroach, German Negative    55. Tobacco Leaf Negative             13 Food Perc - 01/13/23 1404       Test Information   Time Antigen Placed 1410    Allergen Manufacturer Waynette Buttery    Location Back    Number of allergen test 1      Food   3. Wheat Negative             Intradermal - 01/13/23 1445     Time Antigen Placed 1445    Allergen Manufacturer Waynette Buttery    Location Arm    Number of Test 15    Control Negative    Bahia Negative    [place] Territ[place] Negative    Johnson Negative    7 Grass Negative    Ragweed Mix Negative    Weed Mix Negative    Tree Mix Negative    Mold 1 Negative    Mold 3 Negative    Mold 4 Negative    Mite Mix Negative    Cat Negative    Dog Negative    Cockroach Negative             Allergy testing results were read and interpreted by myself, documented by clinical staff.      Malachi Bonds, MD  [facility]

## 2023-02-06 DIAGNOSIS — D485 Neoplasm of uncertain behavior of skin: Secondary | ICD-10-CM | POA: Diagnosis not present

## 2023-02-06 DIAGNOSIS — D225 Melanocytic nevi of trunk: Secondary | ICD-10-CM | POA: Diagnosis not present

## 2023-02-06 DIAGNOSIS — Z1283 Encounter for screening for malignant neoplasm of skin: Secondary | ICD-10-CM | POA: Diagnosis not present

## 2023-02-06 DIAGNOSIS — D2260 Melanocytic nevi of unspecified upper limb, including shoulder: Secondary | ICD-10-CM | POA: Diagnosis not present

## 2023-02-06 DIAGNOSIS — L821 Other seborrheic keratosis: Secondary | ICD-10-CM | POA: Diagnosis not present

## 2023-03-10 DIAGNOSIS — R5383 Other fatigue: Secondary | ICD-10-CM | POA: Diagnosis not present

## 2023-03-10 DIAGNOSIS — Z1231 Encounter for screening mammogram for malignant neoplasm of breast: Secondary | ICD-10-CM | POA: Diagnosis not present

## 2023-03-10 DIAGNOSIS — D250 Submucous leiomyoma of uterus: Secondary | ICD-10-CM | POA: Diagnosis not present

## 2023-03-10 DIAGNOSIS — Z01411 Encounter for gynecological examination (general) (routine) with abnormal findings: Secondary | ICD-10-CM | POA: Diagnosis not present

## 2023-04-08 ENCOUNTER — Other Ambulatory Visit: Payer: Self-pay | Admitting: Obstetrics and Gynecology

## 2023-04-08 DIAGNOSIS — Z01818 Encounter for other preprocedural examination: Secondary | ICD-10-CM

## 2023-05-08 ENCOUNTER — Encounter (HOSPITAL_COMMUNITY): Payer: Self-pay | Admitting: Obstetrics and Gynecology

## 2023-05-08 NOTE — Pre-Procedure Instructions (Addendum)
 Surgical Instructions   Your procedure is scheduled on :  Wednesday,  05-20-2023. Report to [facility] Main Entrance "A" at 5:30 A.M.,  then check in with the Admitting office. Any questions or running late day of surgery: call [phone]  Questions prior to your surgery date: call [phone], Monday-Friday, 8am-4pm. If you experience any cold or flu symptoms such as cough, fever, chills, shortness of breath, etc. between now and your scheduled surgery, please notify your surgeon office.    Remember:  Do not eat any food and do not drink any liquid after midnight the night before your surgery.  This includes water,  candy,  gum,  and  mints.    Take these medicines the morning of surgery with A SIPS OF WATER : Loratadine (claritin) May do Allergy relief eye drops   May take these medicines IF NEEDED: none    One week prior to surgery, STOP taking any Aspirin (unless otherwise instructed by your surgeon) Aleve, Naproxen, Ibuprofen, Motrin, Advil, Goody's, BC's, all herbal medications, fish oil, and non-prescription vitamins.            You will be asked to remove any contacts, glasses, piercing's, hearing aid's, dentures/partials prior to surgery. Please bring cases for these items if needed.    Patients discharged the day of surgery will not be allowed to drive home, and someone needs to stay with them for 24 hours.  SURGICAL WAITING ROOM VISITATION Patients may have no more than 2 support people in the waiting area - these visitors may rotate.   Pre-op nurse will coordinate an appropriate time for 1 ADULT support person, who may not rotate, to accompany patient in pre-op.  Children under the age of 71 must have an adult with them who is not the patient and must remain in the main waiting area with an adult.  If the patient needs to stay at the hospital during part of their recovery, the visitor guidelines for inpatient rooms apply.  Please refer to the [facility] website  for the visitor guidelines for any additional information.   If you received a COVID test during your pre-op visit  it is requested that you wear a mask when out in public, stay away from anyone that may not be feeling well and notify your surgeon if you develop symptoms. If you have been in contact with anyone that has tested positive in the last 10 days please notify you surgeon.      Pre-operative CHG Bathing Instructions   You can play a key role in reducing the risk of infection after surgery. Your skin needs to be as free of germs as possible. You can reduce the number of germs on your skin by washing with CHG (chlorhexidine gluconate) soap before surgery. CHG is an antiseptic soap that kills germs and continues to kill germs even after washing.   DO NOT use if you have an allergy to chlorhexidine/CHG or antibacterial soaps. If your skin becomes reddened or irritated, stop using the CHG and notify Pre-Op nurse day of surgery.  Please get dial soap or other antibacterial soap and shower following the instructions below.             TAKE A SHOWER THE NIGHT BEFORE SURGERY AND THE DAY OF SURGERY    Please keep in mind the following:  DO NOT shave, including legs and underarms, 48 hours prior to surgery.   You may shave your face before/day of surgery.  Place clean sheets  on your bed the night before surgery Use a clean washcloth (not used since being washed) for each shower. DO NOT sleep with pet's night before surgery.  CHG Shower Instructions:  Wash your face and private area with normal soap. If you choose to wash your hair, wash first with your normal shampoo.  After you use shampoo/soap, rinse your hair and body thoroughly to remove shampoo/soap residue.  Turn the water OFF and apply half the bottle of CHG soap to a CLEAN washcloth.  Apply CHG soap ONLY FROM YOUR NECK DOWN TO YOUR TOES (washing for 3-5 minutes)  DO NOT use CHG soap on face, private areas, open wounds, or sores.   Pay special attention to the area where your surgery is being performed.  If you are having back surgery, having someone wash your back for you may be helpful. Wait 2 minutes after CHG soap is applied, then you may rinse off the CHG soap.  Pat dry with a clean towel  Put on clean pajamas    Additional instructions for the day of surgery: DO NOT APPLY any lotions, oils, deodorants (may use underarm deodorant) , cologne/  perfumes or makeup  Do not wear jewelry / piercing's/ metal/ permanent jewelry must be removed prior to arrival day of surgery (this includes plastic ). Do not wear nail polish, gel polish, artificial nails, or any other type of covering on natural finger nails (toe nails are okay) Do not bring valuables to the hospital. [facility] is not responsible for valuables/personal belongings. Put on clean/comfortable clothes.  Please brush your teeth.  Ask your nurse before applying any prescription medications to the skin.

## 2023-05-08 NOTE — Progress Notes (Signed)
 Spoke w/ via phone for pre-op interview--- pt Lab needs dos---- urine preg        Lab results------ lab appt 05-18-2023 @ 1030 getting CBC/ T&S COVID test -----patient states asymptomatic no test needed Arrive at ------- 0530 on 05-20-2023 NPO after MN w/ exception sips of water w/ meds Pre-Surgery Ensure or G2: n/a  Med rec completed Medications to take morning of surgery ----- claritin, eye drops Diabetic medication ----- n/a  GLP1 agonist last dose: n/a GLP1 instructions:  Patient instructed no nail polish to be worn day of surgery Patient instructed to bring photo id and insurance card day of surgery Patient aware to have Driver (ride ) / caregiver    for 24 hours after surgery - husband, Kristy Hancock Patient Special Instructions ----- will pick up bag w/ soap and written instructions at lab appt Pre-Op special Instructions ----- n/a  Patient verbalized understanding of instructions that were given at this phone interview. Patient denies chest pain, sob, fever, cough at the interview.

## 2023-05-17 NOTE — H&P (Signed)
 53 y.o. complains of symptomatic fibroid uterus.  Previously: "Physician interpretation: 9x7x9; EM 2.47mm. Multiple fibroids, largest 5 cm. All smaller anterior fibroids are submucosal. Normal RO, LO not seen. /mahmd Fibroids submucosal, scheduled for Robotic TLH. Cramping is better but bleeding happens every day."  "AEX and Mammo today, also US  d/t HMB and fibroids. wants to proceed with robotic assisted hysterectomy with salpingectomy. LMP- 03/08/23, pt reports bleeding has gotten better but still needs to wear a pad daily. taking COC's. LPAP 02/2021 ASCUS/HPV-neg colo.- 2021 unsure of f/u  no breast or bladder issues -SH//"  Past Medical History:  Diagnosis Date   Abnormal uterine bleeding (AUB)    Chronic constipation    GAD (generalized anxiety disorder)    Gluten intolerance    IC (interstitial cystitis)    05-08-2023 per pt in remission for several yrs   Iron deficiency anemia    Seasonal allergies    Uterine fibroid    Wears contact lenses    Past Surgical History:  Procedure Laterality Date   CESAREAN SECTION  04/07/1999   @[facility]  by dr Kirk Peper. Annitta Kindler   CHOLECYSTECTOMY, LAPAROSCOPIC  07/07/2014   @ [facility] by dr d. Daril Edge   COLONOSCOPY WITH PROPOFOL   2021   [facility]   CYSTOSCOPY WITH HYDRODISTENSION AND BIOPSY  11/08/2010   @[facility]  by dr s. Isla Mari;   w/ fulgeration and  Instillation therapy   ESOPHAGOGASTRODUODENOSCOPY (EGD) WITH PROPOFOL   06/27/2006   @ [facility] by dr s. Claretha Crocker ;   w/ bx's ;  normal   TYMPANOSTOMY TUBE PLACEMENT Bilateral    child    Social History   Socioeconomic History   Marital status: Married    Spouse name: Not on file   Number of children: Not on file   Years of education: Not on file   Highest education level: Not on file  Occupational History   Not on file  Tobacco Use   Smoking status: Former    Types: Cigarettes    Passive exposure: Never   Smokeless tobacco: Never   Tobacco comments:    05-08-2023  pt stated quit smoking in 2000,   started age 37 (21)  89 yrs  Vaping Use   Vaping status: Never Used  Substance and Sexual Activity   Alcohol use: Yes    Comment: occasional   Drug use: Never   Sexual activity: Yes    Birth control/protection: Pill  Other Topics Concern   Not on file  Social History Narrative   Not on file   Social Drivers of Health   Financial Resource Strain: Not on file  Food Insecurity: Not on file  Transportation Needs: Not on file  Physical Activity: Not on file  Stress: Not on file  Social Connections: Not on file  Intimate Partner Violence: Not on file    No current facility-administered medications on file prior to encounter.   Current Outpatient Medications on File Prior to Encounter  Medication Sig Dispense Refill   Ascorbic Acid (VITAMIN C) 1000 MG tablet Take 1,000 mg by mouth at bedtime.     Calcium Carb-Cholecalciferol (CALCIUM 1000 + D PO) Take 1 tablet by mouth at bedtime.     docusate sodium  (COLACE) 250 MG capsule Take 250 mg by mouth at bedtime.     ferrous sulfate 325 (65 FE) MG EC tablet Take 325 mg by mouth at bedtime.     hydrOXYzine  (ATARAX ) 25 MG tablet Take 25 mg by mouth at bedtime.     loratadine (CLARITIN)  10 MG tablet Take 10 mg by mouth daily.     norethindrone-ethinyl estradiol-FE (JUNEL FE 1/20) 1-20 MG-MCG tablet Take 1 tablet by mouth daily.     Tetrahydrozoline-Zn Sulfate (ALLERGY  RELIEF EYE DROPS OP) Place 1 drop into both eyes daily.      Allergies  Allergen Reactions   Hydrocodone Itching   Loestrin Fe 1.5-30 [Norethin Ace-Eth Estrad-Fe] Rash    Vitals:   05/08/23 1444  Weight: 56.7 kg  Height: 5\' 2"  (1.575 m)    Lungs: clear to ascultation Cor:  RRR Abdomen:  soft, nontender, nondistended. Ex:  no cords, erythema Pelvic:   Vulva: no masses, no atrophy, no lesions Vagina: no tenderness, no erythema, no abnormal vaginal discharge, no vesicle(s) or ulcers, no cystocele, no rectocele Cervix: grossly normal, no discharge, no cervical  motion tenderness Uterus: normal shape, no uterine prolapse, non-tender, enlarged (9-10), retroverted Bladder/Urethra: normal meatus, no urethral discharge, no urethral mass, bladder non distended, Urethra well supported Adnexa/Parametria: no parametrial tenderness, no parametrial mass, no adnexal tenderness, no ovarian mass   A:  Robotic TLH/salpingectomies/cysto   P: P: All risks, benefits and alternatives d/w patient and she desires to proceed.  Patient has undergone an ERAS protocol and will receive preop antibiotics and SCDs during the operation.   Pt to have extended recovery but will go home same day if eating, ambulating, voiding and pain control is good.  Oddis Bench

## 2023-05-18 ENCOUNTER — Encounter (HOSPITAL_COMMUNITY)
Admission: RE | Admit: 2023-05-18 | Discharge: 2023-05-18 | Disposition: A | Discharge status: Home / Self Care | Source: Ambulatory Visit | Attending: Obstetrics and Gynecology

## 2023-05-18 DIAGNOSIS — Z01818 Encounter for other preprocedural examination: Secondary | ICD-10-CM

## 2023-05-18 DIAGNOSIS — Z01812 Encounter for preprocedural laboratory examination: Secondary | ICD-10-CM | POA: Diagnosis not present

## 2023-05-18 LAB — CBC
HCT: 39.4 % (ref 36.0–46.0)
Hemoglobin: 13.1 g/dL (ref 12.0–15.0)
MCH: 29 pg (ref 26.0–34.0)
MCHC: 33.2 g/dL (ref 30.0–36.0)
MCV: 87.4 fL (ref 80.0–100.0)
Platelets: 349 10*3/uL (ref 150–400)
RBC: 4.51 MIL/uL (ref 3.87–5.11)
RDW: 14.1 % (ref 11.5–15.5)
WBC: 6.3 10*3/uL (ref 4.0–10.5)
nRBC: 0 % (ref 0.0–0.2)

## 2023-05-18 LAB — TYPE AND SCREEN
ABO/RH(D): O POS
Antibody Screen: NEGATIVE

## 2023-05-20 ENCOUNTER — Ambulatory Visit (HOSPITAL_COMMUNITY)

## 2023-05-20 ENCOUNTER — Other Ambulatory Visit: Payer: Self-pay

## 2023-05-20 ENCOUNTER — Ambulatory Visit (HOSPITAL_COMMUNITY)
Admission: RE | Admit: 2023-05-20 | Discharge: 2023-05-20 | Disposition: A | Discharge status: Home / Self Care | Payer: Self-pay | Attending: Obstetrics and Gynecology | Admitting: Obstetrics and Gynecology

## 2023-05-20 ENCOUNTER — Encounter (HOSPITAL_COMMUNITY): Payer: Self-pay | Admitting: Obstetrics and Gynecology

## 2023-05-20 ENCOUNTER — Encounter (HOSPITAL_COMMUNITY)
Admission: RE | Disposition: A | Discharge status: Home / Self Care | Payer: Self-pay | Source: Home / Self Care | Attending: Obstetrics and Gynecology

## 2023-05-20 DIAGNOSIS — Z87891 Personal history of nicotine dependence: Secondary | ICD-10-CM | POA: Diagnosis not present

## 2023-05-20 DIAGNOSIS — Z01818 Encounter for other preprocedural examination: Secondary | ICD-10-CM

## 2023-05-20 DIAGNOSIS — F419 Anxiety disorder, unspecified: Secondary | ICD-10-CM | POA: Diagnosis not present

## 2023-05-20 DIAGNOSIS — D259 Leiomyoma of uterus, unspecified: Secondary | ICD-10-CM | POA: Diagnosis not present

## 2023-05-20 DIAGNOSIS — D250 Submucous leiomyoma of uterus: Secondary | ICD-10-CM | POA: Diagnosis not present

## 2023-05-20 DIAGNOSIS — Z9889 Other specified postprocedural states: Secondary | ICD-10-CM

## 2023-05-20 HISTORY — DX: Presence of spectacles and contact lenses: Z97.3

## 2023-05-20 HISTORY — DX: Interstitial cystitis (chronic) without hematuria: N30.10

## 2023-05-20 HISTORY — DX: Other constipation: K59.09

## 2023-05-20 HISTORY — DX: Leiomyoma of uterus, unspecified: D25.9

## 2023-05-20 HISTORY — DX: Other seasonal allergic rhinitis: J30.2

## 2023-05-20 HISTORY — PX: ROBOTIC ASSISTED TOTAL HYSTERECTOMY WITH BILATERAL SALPINGO OOPHERECTOMY: SHX6086

## 2023-05-20 HISTORY — DX: Iron deficiency anemia, unspecified: D50.9

## 2023-05-20 HISTORY — DX: Abnormal uterine and vaginal bleeding, unspecified: N93.9

## 2023-05-20 HISTORY — DX: Generalized anxiety disorder: F41.1

## 2023-05-20 HISTORY — PX: CYSTOSCOPY: SHX5120

## 2023-05-20 HISTORY — DX: Non-celiac gluten sensitivity: K90.41

## 2023-05-20 LAB — POCT PREGNANCY, URINE: Preg Test, Ur: NEGATIVE

## 2023-05-20 LAB — ABO/RH: ABO/RH(D): O POS

## 2023-05-20 SURGERY — HYSTERECTOMY, TOTAL, ROBOT-ASSISTED, LAPAROSCOPIC, WITH BILATERAL SALPINGO-OOPHORECTOMY
Anesthesia: General | Site: Pelvis

## 2023-05-20 MED ORDER — FENTANYL CITRATE (PF) 250 MCG/5ML IJ SOLN
INTRAMUSCULAR | Status: AC
  Filled 2023-05-20: qty 5

## 2023-05-20 MED ORDER — LACTATED RINGERS IV SOLN: INTRAVENOUS | Status: DC

## 2023-05-20 MED ORDER — CHLORHEXIDINE GLUCONATE 0.12 % MT SOLN
OROMUCOSAL | Status: AC
  Filled 2023-05-20: qty 15

## 2023-05-20 MED ORDER — KETAMINE HCL 50 MG/5ML IJ SOSY
PREFILLED_SYRINGE | INTRAMUSCULAR | Status: AC
  Filled 2023-05-20: qty 5

## 2023-05-20 MED ORDER — ONDANSETRON HCL 4 MG/2ML IJ SOLN: 4.0000 mg | Freq: Four times a day (QID) | INTRAMUSCULAR | Status: DC | PRN

## 2023-05-20 MED ORDER — MENTHOL 3 MG MT LOZG: 1.0000 | LOZENGE | OROMUCOSAL | Status: DC | PRN

## 2023-05-20 MED ORDER — SUGAMMADEX SODIUM 200 MG/2ML IV SOLN
INTRAVENOUS | Status: DC | PRN
  Administered 2023-05-20: 220 mg via INTRAVENOUS

## 2023-05-20 MED ORDER — METRONIDAZOLE 500 MG/100ML IV SOLN: 500.0000 mg | INTRAVENOUS | Status: DC

## 2023-05-20 MED ORDER — STERILE WATER FOR IRRIGATION IR SOLN
Status: DC | PRN
  Administered 2023-05-20: 1000 mL

## 2023-05-20 MED ORDER — SODIUM CHLORIDE 0.9 % IR SOLN
Status: DC | PRN
  Administered 2023-05-20: 1000 mL

## 2023-05-20 MED ORDER — MIDAZOLAM HCL 2 MG/2ML IJ SOLN
INTRAMUSCULAR | Status: AC
  Filled 2023-05-20: qty 2

## 2023-05-20 MED ORDER — CELECOXIB 200 MG PO CAPS
ORAL_CAPSULE | ORAL | Status: AC
  Filled 2023-05-20: qty 2

## 2023-05-20 MED ORDER — DOCUSATE SODIUM 100 MG PO CAPS: 100.0000 mg | ORAL_CAPSULE | Freq: Two times a day (BID) | ORAL | Status: DC

## 2023-05-20 MED ORDER — SCOPOLAMINE 1 MG/3DAYS TD PT72
1.0000 | MEDICATED_PATCH | TRANSDERMAL | Status: DC
  Administered 2023-05-20: 1.5 mg via TRANSDERMAL

## 2023-05-20 MED ORDER — OXYCODONE HCL 5 MG PO TABS: 5.0000 mg | ORAL_TABLET | Freq: Once | ORAL | Status: DC | PRN

## 2023-05-20 MED ORDER — CELECOXIB 200 MG PO CAPS
400.0000 mg | ORAL_CAPSULE | ORAL | Status: AC
  Administered 2023-05-20: 400 mg via ORAL

## 2023-05-20 MED ORDER — CEFAZOLIN SODIUM-DEXTROSE 2-4 GM/100ML-% IV SOLN
2.0000 g | INTRAVENOUS | Status: AC
  Administered 2023-05-20: 2 g via INTRAVENOUS

## 2023-05-20 MED ORDER — ROCURONIUM BROMIDE 10 MG/ML (PF) SYRINGE
PREFILLED_SYRINGE | INTRAVENOUS | Status: DC | PRN
Start: 2023-05-20 — End: 2023-05-20
  Administered 2023-05-20: 80 mg via INTRAVENOUS

## 2023-05-20 MED ORDER — ROPIVACAINE HCL 5 MG/ML IJ SOLN
INTRAMUSCULAR | Status: AC
  Filled 2023-05-20: qty 30

## 2023-05-20 MED ORDER — POVIDONE-IODINE 10 % EX SWAB: 2.0000 | Freq: Once | CUTANEOUS | Status: DC

## 2023-05-20 MED ORDER — IBUPROFEN 800 MG PO TABS
800.0000 mg | ORAL_TABLET | Freq: Three times a day (TID) | ORAL | Status: DC
  Filled 2023-05-20 (×3): qty 1

## 2023-05-20 MED ORDER — BUPIVACAINE HCL (PF) 0.25 % IJ SOLN
INTRAMUSCULAR | Status: AC
  Filled 2023-05-20: qty 30

## 2023-05-20 MED ORDER — ROCURONIUM BROMIDE 10 MG/ML (PF) SYRINGE
PREFILLED_SYRINGE | INTRAVENOUS | Status: AC
Start: 2023-05-20 — End: ?
  Filled 2023-05-20: qty 10

## 2023-05-20 MED ORDER — SODIUM CHLORIDE (PF) 0.9 % IJ SOLN
INTRAMUSCULAR | Status: AC
  Filled 2023-05-20: qty 30

## 2023-05-20 MED ORDER — AMISULPRIDE (ANTIEMETIC) 5 MG/2ML IV SOLN
10.0000 mg | Freq: Once | INTRAVENOUS | Status: AC | PRN
  Administered 2023-05-20: 10 mg via INTRAVENOUS

## 2023-05-20 MED ORDER — HYDROMORPHONE HCL 1 MG/ML IJ SOLN
0.2500 mg | INTRAMUSCULAR | Status: DC | PRN
  Administered 2023-05-20 (×2): 0.25 mg via INTRAVENOUS
  Administered 2023-05-20: 0.5 mg via INTRAVENOUS

## 2023-05-20 MED ORDER — ACETAMINOPHEN 500 MG PO TABS
1000.0000 mg | ORAL_TABLET | ORAL | Status: AC
  Administered 2023-05-20: 1000 mg via ORAL

## 2023-05-20 MED ORDER — METRONIDAZOLE 500 MG/100ML IV SOLN
INTRAVENOUS | Status: DC | PRN
  Administered 2023-05-20: 500 mg via INTRAVENOUS

## 2023-05-20 MED ORDER — FENTANYL CITRATE PF 50 MCG/ML IJ SOSY: 12.5000 ug | PREFILLED_SYRINGE | INTRAMUSCULAR | Status: DC | PRN

## 2023-05-20 MED ORDER — MIDAZOLAM HCL 2 MG/2ML IJ SOLN
INTRAMUSCULAR | Status: DC | PRN
  Administered 2023-05-20: 2 mg via INTRAVENOUS

## 2023-05-20 MED ORDER — IBUPROFEN 800 MG PO TABS: 800.0000 mg | ORAL_TABLET | Freq: Three times a day (TID) | ORAL | 0 refills | Status: AC

## 2023-05-20 MED ORDER — ONDANSETRON HCL 4 MG/2ML IJ SOLN
INTRAMUSCULAR | Status: AC
Start: 2023-05-20 — End: ?
  Filled 2023-05-20: qty 2

## 2023-05-20 MED ORDER — ONDANSETRON HCL 4 MG/2ML IJ SOLN: 4.0000 mg | Freq: Once | INTRAMUSCULAR | Status: DC | PRN

## 2023-05-20 MED ORDER — CHLORHEXIDINE GLUCONATE 0.12 % MT SOLN
15.0000 mL | Freq: Once | OROMUCOSAL | Status: AC
  Administered 2023-05-20: 15 mL via OROMUCOSAL

## 2023-05-20 MED ORDER — ACETAMINOPHEN 500 MG PO TABS
ORAL_TABLET | ORAL | Status: AC
  Filled 2023-05-20: qty 2

## 2023-05-20 MED ORDER — FENTANYL CITRATE (PF) 250 MCG/5ML IJ SOLN
INTRAMUSCULAR | Status: DC | PRN
  Administered 2023-05-20 (×2): 50 ug via INTRAVENOUS
  Administered 2023-05-20: 150 ug via INTRAVENOUS

## 2023-05-20 MED ORDER — OXYCODONE HCL 5 MG/5ML PO SOLN: 5.0000 mg | Freq: Once | ORAL | Status: DC | PRN

## 2023-05-20 MED ORDER — PROPOFOL 10 MG/ML IV BOLUS
INTRAVENOUS | Status: DC | PRN
  Administered 2023-05-20: 150 mg via INTRAVENOUS

## 2023-05-20 MED ORDER — ONDANSETRON HCL 4 MG PO TABS
4.0000 mg | ORAL_TABLET | Freq: Four times a day (QID) | ORAL | Status: DC | PRN
Start: 2023-05-20 — End: 2023-05-20

## 2023-05-20 MED ORDER — PHENYLEPHRINE HCL-NACL 20-0.9 MG/250ML-% IV SOLN: INTRAVENOUS | Status: DC | PRN

## 2023-05-20 MED ORDER — BUPIVACAINE HCL 0.25 % IJ SOLN
INTRAMUSCULAR | Status: DC | PRN
  Administered 2023-05-20 (×2): 10 mL

## 2023-05-20 MED ORDER — ACETAMINOPHEN 500 MG PO TABS: 1000.0000 mg | ORAL_TABLET | Freq: Once | ORAL | Status: AC

## 2023-05-20 MED ORDER — AMISULPRIDE (ANTIEMETIC) 5 MG/2ML IV SOLN
INTRAVENOUS | Status: AC
  Filled 2023-05-20: qty 4

## 2023-05-20 MED ORDER — SOD CITRATE-CITRIC ACID 500-334 MG/5ML PO SOLN: 30.0000 mL | ORAL | Status: DC

## 2023-05-20 MED ORDER — FLUORESCEIN SODIUM 10 % IV SOLN
INTRAVENOUS | Status: DC | PRN
  Administered 2023-05-20: 1 mL via INTRAVENOUS

## 2023-05-20 MED ORDER — SODIUM CHLORIDE 0.9 % IV SOLN
INTRAVENOUS | Status: DC | PRN
  Administered 2023-05-20: 60 mL

## 2023-05-20 MED ORDER — PHENYLEPHRINE 80 MCG/ML (10ML) SYRINGE FOR IV PUSH (FOR BLOOD PRESSURE SUPPORT)
PREFILLED_SYRINGE | INTRAVENOUS | Status: DC | PRN
  Administered 2023-05-20: 160 ug via INTRAVENOUS

## 2023-05-20 MED ORDER — LIDOCAINE 2% (20 MG/ML) 5 ML SYRINGE
INTRAMUSCULAR | Status: DC | PRN
  Administered 2023-05-20: 60 mg via INTRAVENOUS

## 2023-05-20 MED ORDER — OXYCODONE HCL 5 MG PO TABS
5.0000 mg | ORAL_TABLET | ORAL | Status: DC | PRN
  Administered 2023-05-20: 10 mg via ORAL
  Filled 2023-05-20: qty 2

## 2023-05-20 MED ORDER — HYDROMORPHONE HCL 1 MG/ML IJ SOLN
INTRAMUSCULAR | Status: AC
  Filled 2023-05-20: qty 1

## 2023-05-20 MED ORDER — PROPOFOL 10 MG/ML IV BOLUS
INTRAVENOUS | Status: AC
  Filled 2023-05-20: qty 20

## 2023-05-20 MED ORDER — DEXAMETHASONE SODIUM PHOSPHATE 10 MG/ML IJ SOLN
INTRAMUSCULAR | Status: DC | PRN
  Administered 2023-05-20: 10 mg via INTRAVENOUS

## 2023-05-20 MED ORDER — KETAMINE HCL 10 MG/ML IJ SOLN
INTRAMUSCULAR | Status: DC | PRN
  Administered 2023-05-20 (×3): 10 mg via INTRAVENOUS

## 2023-05-20 MED ORDER — GABAPENTIN 300 MG PO CAPS
ORAL_CAPSULE | ORAL | Status: AC
  Filled 2023-05-20: qty 1

## 2023-05-20 MED ORDER — KETOROLAC TROMETHAMINE 30 MG/ML IJ SOLN: 30.0000 mg | Freq: Once | INTRAMUSCULAR | Status: DC | PRN

## 2023-05-20 MED ORDER — MEPERIDINE HCL 25 MG/ML IJ SOLN: 6.2500 mg | INTRAMUSCULAR | Status: DC | PRN

## 2023-05-20 MED ORDER — CEFAZOLIN SODIUM-DEXTROSE 2-4 GM/100ML-% IV SOLN
INTRAVENOUS | Status: AC
  Filled 2023-05-20: qty 100

## 2023-05-20 MED ORDER — GABAPENTIN 300 MG PO CAPS
300.0000 mg | ORAL_CAPSULE | ORAL | Status: AC
  Administered 2023-05-20: 300 mg via ORAL

## 2023-05-20 MED ORDER — FLUORESCEIN SODIUM 10 % IV SOLN
INTRAVENOUS | Status: AC
  Filled 2023-05-20: qty 5

## 2023-05-20 MED ORDER — DEXAMETHASONE SODIUM PHOSPHATE 10 MG/ML IJ SOLN
INTRAMUSCULAR | Status: AC
  Filled 2023-05-20: qty 1

## 2023-05-20 MED ORDER — ORAL CARE MOUTH RINSE: 15.0000 mL | Freq: Once | OROMUCOSAL | Status: AC

## 2023-05-20 MED ORDER — SCOPOLAMINE 1 MG/3DAYS TD PT72
MEDICATED_PATCH | TRANSDERMAL | Status: AC
  Filled 2023-05-20: qty 1

## 2023-05-20 SURGICAL SUPPLY — 51 items
BARRIER ADHS 3X4 INTERCEED (GAUZE/BANDAGES/DRESSINGS) IMPLANT
CANISTER SUCT 3000ML PPV (MISCELLANEOUS) ×2 IMPLANT
COVER BACK TABLE 60X90IN (DRAPES) ×2 IMPLANT
COVER TIP SHEARS 8 DVNC (MISCELLANEOUS) ×2 IMPLANT
DEFOGGER SCOPE WARMER CLEARIFY (MISCELLANEOUS) ×2 IMPLANT
DERMABOND ADVANCED .7 DNX12 (GAUZE/BANDAGES/DRESSINGS) ×2 IMPLANT
DRAPE ARM DVNC X/XI (DISPOSABLE) ×8 IMPLANT
DRAPE COLUMN DVNC XI (DISPOSABLE) ×2 IMPLANT
DRAPE SURG IRRIG POUCH 19X23 (DRAPES) ×2 IMPLANT
DRAPE UTILITY XL STRL (DRAPES) ×2 IMPLANT
DRIVER NDL MEGA SUTCUT DVNCXI (INSTRUMENTS) ×2 IMPLANT
DRIVER NDLE MEGA SUTCUT DVNCXI (INSTRUMENTS) ×2 IMPLANT
DURAPREP 26ML APPLICATOR (WOUND CARE) ×2 IMPLANT
ELECTRODE REM PT RTRN 9FT ADLT (ELECTROSURGICAL) ×2 IMPLANT
FORCEPS BPLR FENES DVNC XI (FORCEP) ×2 IMPLANT
FORCEPS PROGRASP DVNC XI (FORCEP) IMPLANT
FORCEPS TENACULUM DVNC XI (FORCEP) IMPLANT
GAUZE 4X4 16PLY ~~LOC~~+RFID DBL (SPONGE) IMPLANT
GLOVE BIO SURGEON STRL SZ7 (GLOVE) ×6 IMPLANT
GLOVE BIOGEL PI IND STRL 6 (GLOVE) IMPLANT
GLOVE BIOGEL PI IND STRL 7.0 (GLOVE) ×4 IMPLANT
GLOVE SURG SS PI 7.0 STRL IVOR (GLOVE) IMPLANT
HIBICLENS CHG 4% 4OZ BTL (MISCELLANEOUS) ×2 IMPLANT
IRRIGATION STRYKERFLOW (MISCELLANEOUS) ×2 IMPLANT
KIT PINK PAD W/HEAD ARE REST (MISCELLANEOUS) ×2 IMPLANT
KIT PINK PAD W/HEAD ARM REST (MISCELLANEOUS) ×2 IMPLANT
LEGGING LITHOTOMY PAIR STRL (DRAPES) ×2 IMPLANT
MANIPULATOR ADVINCU DEL 2.5 PL (MISCELLANEOUS) IMPLANT
MANIPULATOR ADVINCU DEL 3.0 PL (MISCELLANEOUS) IMPLANT
MANIPULATOR ADVINCU DEL 3.5 PL (MISCELLANEOUS) IMPLANT
MANIPULATOR ADVINCU DEL 4.0 PL (MISCELLANEOUS) IMPLANT
NDL INSUFFLATION 14GA 120MM (NEEDLE) ×2 IMPLANT
NEEDLE INSUFFLATION 14GA 120MM (NEEDLE) ×2 IMPLANT
OBTURATOR OPTICALSTD 8 DVNC (TROCAR) ×2 IMPLANT
PACK ROBOT WH (CUSTOM PROCEDURE TRAY) ×2 IMPLANT
PACK ROBOTIC GOWN (GOWN DISPOSABLE) ×2 IMPLANT
PAD OB MATERNITY 11 LF (PERSONAL CARE ITEMS) ×2 IMPLANT
POUCH LAPAROSCOPIC INSTRUMENT (MISCELLANEOUS) IMPLANT
SCISSORS MNPLR CVD DVNC XI (INSTRUMENTS) ×2 IMPLANT
SEAL UNIV 5-12 XI (MISCELLANEOUS) ×6 IMPLANT
SET IRRIG Y TYPE TUR BLADDER L (SET/KITS/TRAYS/PACK) ×2 IMPLANT
SET TRI-LUMEN FLTR TB AIRSEAL (TUBING) ×2 IMPLANT
SUT VIC AB 0 CT1 36 (SUTURE) ×2 IMPLANT
SUT VICRYL RAPIDE 3 0 (SUTURE) ×4 IMPLANT
SUT VLOC 180 0 9IN GS21 (SUTURE) ×2 IMPLANT
TOWEL GREEN STERILE (TOWEL DISPOSABLE) ×2 IMPLANT
TRAY FOLEY W/BAG SLVR 14FR (SET/KITS/TRAYS/PACK) ×2 IMPLANT
TROCAR PORT AIRSEAL 5X120 (TROCAR) ×2 IMPLANT
TUBING TUR DISP (UROLOGICAL SUPPLIES) ×2 IMPLANT
UNDERPAD 30X36 HEAVY ABSORB (UNDERPADS AND DIAPERS) ×2 IMPLANT
WATER STERILE IRR 1000ML POUR (IV SOLUTION) ×2 IMPLANT

## 2023-05-20 NOTE — Transfer of Care (Signed)
 Immediate Anesthesia Transfer of Care Note  Patient: Ludmila Ebarb  Procedure(s) Performed: HYSTERECTOMY, TOTAL, ROBOT-ASSISTED, LAPAROSCOPIC, WITH BILATERAL SALPINGECTOMY (Pelvis) CYSTOSCOPY (Bladder)  Patient Location: PACU  Anesthesia Type:General  Level of Consciousness: awake and sedated  Airway & Oxygen Therapy: Patient Spontanous Breathing and Patient connected to face mask oxygen  Post-op Assessment: Report given to RN and Post -op Vital signs reviewed and stable  Post vital signs: Reviewed and stable  Last Vitals:  Vitals Value Taken Time  BP 164/100 05/20/23 0920  Temp    Pulse 98 05/20/23 0927  Resp 17 05/20/23 0927  SpO2 99 % 05/20/23 0927  Vitals shown include unfiled device data.  Last Pain:  Vitals:   05/20/23 0604  TempSrc: Oral  PainSc: 0-No pain      Patients Stated Pain Goal: 6 (05/20/23 0604)  Complications: No notable events documented.

## 2023-05-20 NOTE — Anesthesia Postprocedure Evaluation (Signed)
 Anesthesia Post Note  Patient: Macy Polio  Procedure(s) Performed: HYSTERECTOMY, TOTAL, ROBOT-ASSISTED, LAPAROSCOPIC, WITH BILATERAL SALPINGECTOMY (Pelvis) CYSTOSCOPY (Bladder)     Patient location during evaluation: PACU Anesthesia Type: General Level of consciousness: awake and alert, oriented and patient cooperative Pain management: pain level controlled Vital Signs Assessment: post-procedure vital signs reviewed and stable Respiratory status: spontaneous breathing, nonlabored ventilation and respiratory function stable Cardiovascular status: blood pressure returned to baseline and stable Postop Assessment: no apparent nausea or vomiting Anesthetic complications: no   No notable events documented.  Last Vitals:  Vitals:   05/20/23 1045 05/20/23 1120  BP: (!) 152/81 (!) 151/85  Pulse: 78 79  Resp: 17 18  Temp:    SpO2: 94% 97%    Last Pain:  Vitals:   05/20/23 1120  TempSrc:   PainSc: 3                  Jacquelyne Matte

## 2023-05-20 NOTE — Brief Op Note (Signed)
 05/20/2023  9:23 AM  PATIENT:  Kristy Hancock  53 y.o. female  PRE-OPERATIVE DIAGNOSIS:  uterine leiomyoma  POST-OPERATIVE DIAGNOSIS:  uterine leiomyoma  PROCEDURE:  Procedure(s): HYSTERECTOMY, TOTAL, ROBOT-ASSISTED, LAPAROSCOPIC, WITH BILATERAL SALPINGECTOMY (N/A) CYSTOSCOPY (N/A)  SURGEON:  Surgeons and Role:    * Matt Song, MD - Primary    * Alene Ana, MD - Assisting  ANESTHESIA:   general  EBL: 100cc  DRAINS: Urinary Catheter (Foley)   LOCAL MEDICATIONS USED:  MARCAINE   and Ropivicaine   SPECIMEN:  Source of Specimen:  Uterus, tubes, cervix  DISPOSITION OF SPECIMEN:  PATHOLOGY  COUNTS:  YES  TOURNIQUET:  * No tourniquets in log *  DICTATION: .Note written in EPIC  PLAN OF CARE: Admit for overnight observation  PATIENT DISPOSITION:  PACU - hemodynamically stable.   Delay start of Pharmacological VTE agent (>24hrs) due to surgical blood loss or risk of bleeding: not applicable

## 2023-05-20 NOTE — TOC Transition Note (Signed)
 Transition of Care [facility]) - Discharge Note   Patient Details  Name: Kristy Hancock MRN: [id number] Date of Birth: 10-May-1970  [facility] CM/SW Contact:  Jeani Mill, RN Phone Number: 05/20/2023, 1:51 PM   Clinical Narrative:    Patient stable to discharge home.  No TOC needs at this time.    Final next level of care: Memory Care Barriers to Discharge: Barriers Resolved   Patient Goals and CMS Choice Patient states their goals for this hospitalization and ongoing recovery are:: return home          Discharge Placement               Home        Discharge Plan and Services Additional resources added to the After Visit Summary for                                       Social Drivers of Health (SDOH) Interventions SDOH Screenings   Tobacco Use: Medium Risk (05/20/2023)     Readmission Risk Interventions     No data to display

## 2023-05-20 NOTE — Op Note (Signed)
 9:23 AM  PATIENT:  Kristy Hancock  53 y.o. female  PRE-OPERATIVE DIAGNOSIS:  uterine leiomyoma  POST-OPERATIVE DIAGNOSIS:  uterine leiomyoma  PROCEDURE:  Procedure(s): HYSTERECTOMY, TOTAL, ROBOT-ASSISTED, LAPAROSCOPIC, WITH BILATERAL SALPINGECTOMY (N/A) CYSTOSCOPY (N/A)  SURGEON:  Surgeons and Role:    * Matt Song, MD - Primary    * Alene Ana, MD - Assisting  ANESTHESIA:   general  EBL: 100cc  DRAINS: Urinary Catheter (Foley)   LOCAL MEDICATIONS USED:  MARCAINE   and Ropivicaine   SPECIMEN:  Source of Specimen:  Uterus, tubes, cervix  DISPOSITION OF SPECIMEN:  PATHOLOGY  COUNTS:  YES  TOURNIQUET:  * No tourniquets in log *  DICTATION: .Note written in EPIC  PLAN OF CARE: Admit for overnight observation  PATIENT DISPOSITION:  PACU - hemodynamically stable.   Delay start of Pharmacological VTE agent (>24hrs) due to surgical blood loss or risk of bleeding: not applicable Complications:  None.  Findings:  14 weeks size uterus.  Ovaries were normal.  The ureters were identified during multiple points of the case and were always out of the field of dissection.  On cystoscopy, the bladder was intact and bilateral spill was seen from each ureteral oriface.    Medications:  Ancef .  Ropivicaine.   Marcaine . Fluorocein   Technique:   After adequate anesthesia was achieved the patient was positioned, prepped and draped in usual sterile fashion.  A speculum was placed in the vagina and the cervix dilated with pratt dilators.  The 3.5 cm Koh ring Advincula was assembled and placed in proper fashion.  The  Speculum was removed and the bladder catheterized with a foley.     Attention was turned to the abdomen where a 1 cm incision was made 1 cm above the umbilicus.  The veress needle was introduced without aspiration of bowel contents or blood and the abdomen insufflated. The 8.5 mm Robotic trocar was placed and the other three trocar sites were marked out, all  approximately 10 cm from each other and the camera.  Two 8.5 mm trocars were placed on either side of the camera port and a 5 mm assistant port was placed 3 cm above the line of the other trocars.  All trocars were inserted under direct visualization of the camera.  The patient was placed in trendelenburg and then the Robot docked.  The fenestrated bipolar were placed on arm 1 and the Hot shears on arm 3 and introduced under direct visualization of the camera.   I then broke scrub and sat down at the console.  The above findings were noted and the ureters identified well out of the field of dissection.  The right fallopian tube was isolated and cauterized with the bipolar.  The Utero-ovarian ligament was then divided with the bipolar cautery and shears.  The posterior broad ligament was then divided with the hot shears until the uterosacral ligament.  The Broad and cardinal ligaments were then cauterized against the cervix to the level of the Koh ring, securing the uterine artery.  Each pedicle was then incised with the shears.  The anterior leaf was then incised at the reflection of the vessico-uterine junction and the lateral bladder retracted inferiorly after the round ligament had been divided with the bipolar forceps.  The left tube was cauterized with the bipolar and divided with the shears;  then the left utero-ovarian ligament divided with the bipolar forceps and the scissors.  The round ligament was divided as well and the posterior leaf of  the broad ligament then divided with the hot shears. The broad and cardinal ligaments were then cauterized on the left in the same way.   At the level of the internal os, the uterine arteries were bilaterally cauterized with the bipolar.  The ureters were identified well out of the field of dissection.     The bladder was then able to be retracted inferiorly and the vesico-uterine fascia was incised in the midline until the bladder was removed one cm below the Koh  ring.  The hot shears then circumferentially incised the vagina at the level of the reflection on the [facility] ring.  Once the uterus and cervix were amputated, cautery was used to insure hemostasis of the cuff.  Once hemostasis was achieved, the scissors were changed to the mega suture cut needle driver and the cuff was closed with a running stitches of 0-vicryl V loc.  Cautery was used to ensure hemostasis of the left pedicles very superficially. The ureters were peristalsing bilaterally well and very lateral to the areas of operation.  The uterus was removed intact through the vagina.   The Robot was then undocked and I scrubbed back in.  The needle was removed and Ropivicaine was introduced into the pelvis. The skin incisions were closed with subcuticular stitches of 3-0 vicryl Rapide and Dermabond.  All instruments were removed from the vagina and cystoscopy performed, revealing an intact bladder and vigourous spill of urine from each ureteral orifice.  The cystoscope was removed and the patient taken to the recovery room in stable condition.   Xylon Croom A

## 2023-05-20 NOTE — Discharge Summary (Signed)
 Physician Discharge Summary  Patient ID: Kristy Hancock MRN: [id number] DOB/AGE: 07-29-1970 53 y.o.  Admit date: 05/20/2023 Discharge date: 05/20/2023  Admission Diagnoses:symptomatic fibroid uterus.  Discharge Diagnoses:  Principal Problem:   Postoperative state   Discharged Condition: good  Hospital Course: Uncomplicated robotic TLH, salpingectomies    Consults: None  Significant Diagnostic Studies: none  Treatments: surgery: Uncomplicated robotic TLH, salpingectomies    Discharge Exam: Blood pressure (!) 164/100, pulse 82, temperature 98 F (36.7 C), temperature source Oral, resp. rate 16, height 5\' 2"  (1.575 m), weight 55.8 kg, SpO2 99%.   Disposition: Discharge disposition: 01-Home or Self Care       Discharge Instructions     Call MD for:  temperature >100.4   Complete by: As directed    Diet - low sodium heart healthy   Complete by: As directed    Discharge instructions   Complete by: As directed    No driving on narcotics, no sexual activity for 2 weeks.   Increase activity slowly   Complete by: As directed    May shower / Bathe   Complete by: As directed    Shower, no bath for 2 weeks.   Remove dressing in 24 hours   Complete by: As directed    Sexual Activity Restrictions   Complete by: As directed    No sexual activity for 2 weeks.      Allergies as of 05/20/2023       Reactions   Hydrocodone Itching   Loestrin Fe 1.5-30 [norethin Ace-eth Estrad-fe] Rash        Medication List     STOP taking these medications    Junel FE 1/20 1-20 MG-MCG tablet Generic drug: norethindrone-ethinyl estradiol-FE       TAKE these medications    ALLERGY  RELIEF EYE DROPS OP Place 1 drop into both eyes daily.   CALCIUM 1000 + D PO Take 1 tablet by mouth at bedtime.   docusate sodium  250 MG capsule Commonly known as: COLACE Take 250 mg by mouth at bedtime.   ferrous sulfate 325 (65 FE) MG EC tablet Take 325 mg by mouth at bedtime.   hydrOXYzine   25 MG tablet Commonly known as: ATARAX  Take 25 mg by mouth at bedtime.   loratadine 10 MG tablet Commonly known as: CLARITIN Take 10 mg by mouth daily.   vitamin C 1000 MG tablet Take 1,000 mg by mouth at bedtime.      Percocet sent from office  Follow-up Information     Matt Song, MD Follow up in 2 week(s).   Specialty: Obstetrics and Gynecology Contact information: [address] [place] [address] [phone]                 Signed: Oddis Bench 05/20/2023, 9:29 AM

## 2023-05-20 NOTE — Progress Notes (Signed)
 There has been no change in the patients history, status or exam since the history and physical.  Vitals:   05/08/23 1444 05/20/23 0604  BP:  (!) 143/77  Pulse:  82  Resp:  16  Temp:  98 F (36.7 C)  TempSrc:  Oral  SpO2:  99%  Weight: 56.7 kg 55.8 kg  Height: 5\' 2"  (1.575 m) 5\' 2"  (1.575 m)    Results for orders placed or performed during the hospital encounter of 05/20/23 (from the past 72 hours)  Pregnancy, urine POC     Status: None   Collection Time: 05/20/23  5:52 AM  Result Value Ref Range   Preg Test, Ur NEGATIVE NEGATIVE    Comment:        THE SENSITIVITY OF THIS METHODOLOGY IS >24 mIU/mL   ABO/Rh     Status: None   Collection Time: 05/20/23  6:20 AM  Result Value Ref Range   ABO/RH(D)      O POS Performed at [facility], [address] [place], [place] [address]     Manuela Sella Horvathprog

## 2023-05-20 NOTE — Anesthesia Preprocedure Evaluation (Addendum)
 Anesthesia Evaluation  Patient identified by MRN, date of birth, ID band Patient awake    Reviewed: Allergy  & Precautions, H&P , NPO status , Patient's Chart, lab work & pertinent test results  Airway Mallampati: III  TM Distance: >3 FB Neck ROM: Full    Dental  (+) Teeth Intact, Dental Advisory Given   Pulmonary former smoker   Pulmonary exam normal breath sounds clear to auscultation       Cardiovascular negative cardio ROS Normal cardiovascular exam Rhythm:Regular Rate:Normal     Neuro/Psych  PSYCHIATRIC DISORDERS Anxiety     negative neurological ROS     GI/Hepatic negative GI ROS, Neg liver ROS,,,  Endo/Other  negative endocrine ROS    Renal/GU negative Renal ROS   Interstitital cystitis    Musculoskeletal negative musculoskeletal ROS (+)    Abdominal   Peds negative pediatric ROS (+)  Hematology negative hematology ROS (+) Hb 13.1, plt 349   Anesthesia Other Findings   Reproductive/Obstetrics Uterine leiomyoma                              Anesthesia Physical Anesthesia Plan  ASA: 2  Anesthesia Plan: General   Post-op Pain Management: Tylenol  PO (pre-op)*, Toradol  IV (intra-op)*, Precedex, Ketamine  IV* and Dilaudid  IV   Induction: Intravenous  PONV Risk Score and Plan: 4 or greater and Ondansetron , Dexamethasone , Midazolam , Treatment may vary due to age or medical condition and Scopolamine  patch - Pre-op  Airway Management Planned: Oral ETT  Additional Equipment: None  Intra-op Plan:   Post-operative Plan: Extubation in OR  Informed Consent: I have reviewed the patients History and Physical, chart, labs and discussed the procedure including the risks, benefits and alternatives for the proposed anesthesia with the patient or authorized representative who has indicated his/her understanding and acceptance.     Dental advisory given  Plan Discussed with:  CRNA  Anesthesia Plan Comments:        Anesthesia Quick Evaluation

## 2023-05-20 NOTE — Anesthesia Procedure Notes (Signed)
 Procedure Name: Intubation Date/Time: 05/20/2023 7:38 AM  Performed by: Bonniejean Piano C, CRNAPre-anesthesia Checklist: Patient identified, Emergency Drugs available, Suction available and Patient being monitored Patient Re-evaluated:Patient Re-evaluated prior to induction Oxygen Delivery Method: Circle system utilized Preoxygenation: Pre-oxygenation with 100% oxygen Induction Type: IV induction Ventilation: Mask ventilation without difficulty Laryngoscope Size: Mac and 3 Grade View: Grade III Tube type: Oral Number of attempts: 1 Airway Equipment and Method: Stylet and Oral airway Placement Confirmation: ETT inserted through vocal cords under direct vision, positive ETCO2 and breath sounds checked- equal and bilateral Secured at: 21 cm Tube secured with: Tape Dental Injury: Teeth and Oropharynx as per pre-operative assessment

## 2023-05-21 ENCOUNTER — Encounter (HOSPITAL_COMMUNITY): Payer: Self-pay | Admitting: Obstetrics and Gynecology

## 2023-05-21 LAB — SURGICAL PATHOLOGY

## 2023-06-18 DIAGNOSIS — N898 Other specified noninflammatory disorders of vagina: Secondary | ICD-10-CM | POA: Diagnosis not present

## 2023-06-18 DIAGNOSIS — N760 Acute vaginitis: Secondary | ICD-10-CM | POA: Diagnosis not present

## 2023-06-18 DIAGNOSIS — R319 Hematuria, unspecified: Secondary | ICD-10-CM | POA: Diagnosis not present

## 2024-01-07 ENCOUNTER — Other Ambulatory Visit (HOSPITAL_COMMUNITY): Payer: Self-pay | Admitting: Internal Medicine

## 2024-01-07 DIAGNOSIS — M79602 Pain in left arm: Secondary | ICD-10-CM | POA: Diagnosis not present

## 2024-01-07 DIAGNOSIS — R2232 Localized swelling, mass and lump, left upper limb: Secondary | ICD-10-CM

## 2024-01-07 DIAGNOSIS — E611 Iron deficiency: Secondary | ICD-10-CM | POA: Diagnosis not present

## 2024-01-07 DIAGNOSIS — Z8669 Personal history of other diseases of the nervous system and sense organs: Secondary | ICD-10-CM | POA: Diagnosis not present

## 2024-01-09 ENCOUNTER — Ambulatory Visit (HOSPITAL_COMMUNITY): Admission: RE | Admit: 2024-01-09 | Discharge: 2024-01-09 | Attending: Internal Medicine | Admitting: Internal Medicine

## 2024-01-09 DIAGNOSIS — R2232 Localized swelling, mass and lump, left upper limb: Secondary | ICD-10-CM
